# Patient Record
Sex: Male | Born: 1935
Health system: Southern US, Community
[De-identification: ages and names within clinical notes are randomized; demographics above are authoritative.]

## PROBLEM LIST (undated history)

## (undated) DIAGNOSIS — I2510 Atherosclerotic heart disease of native coronary artery without angina pectoris: Secondary | ICD-10-CM

## (undated) DIAGNOSIS — E785 Hyperlipidemia, unspecified: Secondary | ICD-10-CM

## (undated) DIAGNOSIS — N529 Male erectile dysfunction, unspecified: Secondary | ICD-10-CM

## (undated) DIAGNOSIS — I10 Essential (primary) hypertension: Secondary | ICD-10-CM

## (undated) HISTORY — DX: Hyperlipidemia, unspecified: E78.5

## (undated) HISTORY — DX: Essential (primary) hypertension: I10

## (undated) HISTORY — DX: Atherosclerotic heart disease of native coronary artery without angina pectoris: I25.10

## (undated) HISTORY — DX: Male erectile dysfunction, unspecified: N52.9

---

## 2005-02-01 ENCOUNTER — Encounter: Payer: Self-pay | Admitting: Family Medicine

## 2005-11-01 ENCOUNTER — Encounter: Payer: Self-pay | Admitting: Family Medicine

## 2009-10-03 ENCOUNTER — Encounter: Payer: Self-pay | Admitting: Family Medicine

## 2009-10-04 ENCOUNTER — Encounter: Payer: Self-pay | Admitting: Family Medicine

## 2009-10-06 ENCOUNTER — Ambulatory Visit: Payer: Self-pay | Admitting: Family Medicine

## 2009-10-06 ENCOUNTER — Encounter: Admission: RE | Admit: 2009-10-06 | Discharge: 2009-10-06 | Payer: Self-pay | Admitting: Family Medicine

## 2009-10-06 DIAGNOSIS — M109 Gout, unspecified: Secondary | ICD-10-CM | POA: Insufficient documentation

## 2009-10-06 DIAGNOSIS — N183 Chronic kidney disease, stage 3 unspecified: Secondary | ICD-10-CM | POA: Insufficient documentation

## 2009-10-06 DIAGNOSIS — E785 Hyperlipidemia, unspecified: Secondary | ICD-10-CM | POA: Insufficient documentation

## 2009-10-06 DIAGNOSIS — E1129 Type 2 diabetes mellitus with other diabetic kidney complication: Secondary | ICD-10-CM | POA: Insufficient documentation

## 2009-10-06 DIAGNOSIS — Z87898 Personal history of other specified conditions: Secondary | ICD-10-CM | POA: Insufficient documentation

## 2009-10-06 LAB — CONVERTED CEMR LAB: Hgb A1c MFr Bld: 5.7 %

## 2009-10-07 ENCOUNTER — Encounter: Payer: Self-pay | Admitting: Family Medicine

## 2009-10-07 LAB — CONVERTED CEMR LAB
Albumin: 4.4 g/dL (ref 3.5–5.2)
Alkaline Phosphatase: 42 units/L (ref 39–117)
BUN: 19 mg/dL (ref 6–23)
CO2: 26 meq/L (ref 19–32)
Eosinophils Absolute: 0.1 10*3/uL (ref 0.0–0.7)
Eosinophils Relative: 2 % (ref 0–5)
Glucose, Bld: 103 mg/dL — ABNORMAL HIGH (ref 70–99)
HCT: 40.9 % (ref 39.0–52.0)
Lymphocytes Relative: 26 % (ref 12–46)
Lymphs Abs: 1.3 10*3/uL (ref 0.7–4.0)
MCV: 89.7 fL (ref 78.0–100.0)
Monocytes Relative: 10 % (ref 3–12)
Potassium: 4.6 meq/L (ref 3.5–5.3)
RBC: 4.56 M/uL (ref 4.22–5.81)
Sodium: 140 meq/L (ref 135–145)
Total Protein: 6.9 g/dL (ref 6.0–8.3)
WBC: 5.1 10*3/uL (ref 4.0–10.5)

## 2009-10-11 ENCOUNTER — Telehealth (INDEPENDENT_AMBULATORY_CARE_PROVIDER_SITE_OTHER): Payer: Self-pay | Admitting: *Deleted

## 2010-01-05 ENCOUNTER — Ambulatory Visit: Payer: Self-pay | Admitting: Family Medicine

## 2010-01-05 LAB — CONVERTED CEMR LAB
Albumin/Creatinine Ratio, Urine, POC: <30
Bilirubin Urine: NEGATIVE
Creatinine,U: 300 mg/dL
Glucose, Urine, Semiquant: NEGATIVE
Ketones, urine, test strip: NEGATIVE
Microalbumin U total vol: 10 mg/L
Specific Gravity, Urine: 1.025
Urobilinogen, UA: 0.2

## 2010-01-09 ENCOUNTER — Encounter: Payer: Self-pay | Admitting: Family Medicine

## 2010-01-10 LAB — CONVERTED CEMR LAB
AST: 9 units/L (ref 0–37)
Albumin: 4.4 g/dL (ref 3.5–5.2)
Alkaline Phosphatase: 54 units/L (ref 39–117)
BUN: 19 mg/dL (ref 6–23)
Creatinine, Ser: 1.51 mg/dL — ABNORMAL HIGH (ref 0.40–1.50)
Glucose, Bld: 161 mg/dL — ABNORMAL HIGH (ref 70–99)
HDL: 37 mg/dL — ABNORMAL LOW (ref >39)
LDL Cholesterol: 90 mg/dL (ref 0–99)
Potassium: 4.7 meq/L (ref 3.5–5.3)
Total Bilirubin: 1.2 mg/dL (ref 0.3–1.2)
Total CHOL/HDL Ratio: 4.2
Triglycerides: 136 mg/dL (ref <150)
Uric Acid, Serum: 4.8 mg/dL (ref 4.0–7.8)
VLDL: 27 mg/dL (ref 0–40)

## 2010-02-17 ENCOUNTER — Telehealth: Payer: Self-pay | Admitting: Family Medicine

## 2010-02-21 ENCOUNTER — Telehealth: Payer: Self-pay | Admitting: Family Medicine

## 2010-02-21 NOTE — Letter (Signed)
Summary: [facility]   Imported By: Lanelle Bal 11/09/2009 14:25:08  _____________________________________________________________________  External Attachment:    Type:   Image     Comment:   External Document

## 2010-02-21 NOTE — Letter (Signed)
Summary: Surgical Clearance/Drs Graylin Shiver & Ragno  Surgical Clearance/Drs Graylin Shiver & Ragno   Imported By: Lanelle Bal 10/18/2009 11:54:18  _____________________________________________________________________  External Attachment:    Type:   Image     Comment:   External Document

## 2010-02-21 NOTE — Letter (Signed)
Summary: [facility]  [facility]   Imported By: Lanelle Bal 10/19/2009 13:48:11  _____________________________________________________________________  External Attachment:    Type:   Image     Comment:   External Document

## 2010-02-21 NOTE — Letter (Signed)
Summary: [facility]   Imported By: Lanelle Bal 11/09/2009 14:24:24  _____________________________________________________________________  External Attachment:    Type:   Image     Comment:   External Document

## 2010-02-21 NOTE — Progress Notes (Signed)
Summary: RC to Dr. Coralie Keens nurse  Phone Note From Other Clinic   Caller: [facility] w/ Dr. Cleotis Nipper [phone] Summary of Call: Please call Beth back re: Pt's surgical clearance and [facility]. Initial call taken by: Payton Spark CMA,  October 11, 2009 12:21 PM  Follow-up for Phone Call        fax received from office. Follow-up by: Kathlene November,  October 11, 2009 2:05 PM

## 2010-02-21 NOTE — Assessment & Plan Note (Signed)
Summary: NOV: Pre-op for dental work   Vital Signs:  Patient profile:   76 year old male Height:      74 inches Weight:      271 pounds Pulse rate:   79 / minute BP sitting:   116 / 64  (right arm) Cuff size:   large  Vitals Entered By: Avon Gully CMA, Duncan Dull) (October 06, 2009 2:15 PM) CC: NP--medical clearance for dental surgery   CC:  NP--medical clearance for dental surgery.  History of Present Illness: Pre-op clearance for dental surgery. Also to estab care today.  He is diabetic, has gout, high cholesterol and TG adn BPH. He is estab with a nephrologist at [facility] but would like to change to someone in [facility].   Neg screen for depression on his intake sheet.  He performs all of his own ADLs.  His long term girlfriend is here with him today.    Habits & Providers  Alcohol-Tobacco-Diet     Alcohol drinks/day: 0     Tobacco Status: quit     Year Quit: 1986  Exercise-Depression-Behavior     Does Patient Exercise: no     STD Risk: never     Drug Use: no  Current Medications (verified): 1)  Lovaza 1 Gm Caps (Omega-3-Acid Ethyl Esters) .... Take One Tablet By Mouth Twice Daily 2)  Onglyza 5 Mg Tabs (Saxagliptin Hcl) .... Take One Tablet By Mouth Once A Day 3)  Diovan 320 Mg Tabs (Valsartan) .... Take One Tablet By Mouth Once   A Day 4)  Allopurinol 300 Mg Tabs (Allopurinol) .... Take One Tablet By Mouth Once A Day 5)  Trilipix 135 Mg Cpdr (Choline Fenofibrate) .... Take One Tablet By Mouth Once A Day 6)  Actos 30 Mg Tabs (Pioglitazone Hcl) .... Take One Tablet By Mouth Once A Day 7)  Flomax 0.4 Mg Caps (Tamsulosin Hcl) .... One Tablet By Mouth Once A Day 8)  Simvastatin 20 Mg Tabs (Simvastatin) .... One Tablet By Mouth Once A Day 9)  Lantus 100 Unit/ml Soln (Insulin Glargine) .Marland Kitchen.. 10  Units Once A Day  Allergies (verified): No Known Drug Allergies  Comments:  Nurse/Medical Assistant: The patient's medications and allergies were reviewed with the  patient and were updated in the Medication and Allergy Lists. Avon Gully CMA, Duncan Dull) (October 06, 2009 2:23 PM)  Past History:  Past Surgical History: None  Family History: Mother wtih DM  Social History: HS degree. Married Former Smoker, quit 1986 Alcohol use-no Drug use-no Regular exercise-no 2 caffeinated drinks per day. Smoking Status:  quit Does Patient Exercise:  no STD Risk:  never Drug Use:  no  Review of Systems       No fever/sweats/weakness, unexplained weight loss/gain.  No vison changes.  No difficulty hearing/ringing in ears, hay fever/allergies.  No chest pain/discomfort, palpitations.  No Br lump/nipple discharge.  No cough/wheeze.  No blood in BM, nausea/vomiting/diarrhea.  No nighttime urination, leaking urine, unusual vaginal bleeding, discharge (penis or vagina).  No muscle/joint pain. No rash, change in mole.  No HA, memory loss.  No anxiety, sleep d/o, depression.  No easy bruising/bleeding, unexplained lump   Physical Exam  General:  Well-developed,well-nourished,in no acute distress; alert,appropriate and cooperative throughout examination Head:  Normocephalic and atraumatic without obvious abnormalities. No apparent alopecia or balding. Eyes:  No corneal or conjunctival inflammation noted. EOMI. Perrla. Left lid drooping over the pupil Ears:  External ear exam shows no significant lesions or deformities.  Otoscopic  examination reveals clear canals, tympanic membranes are intact bilaterally without bulging, retraction, inflammation or discharge. Hearing is grossly normal bilaterally. Nose:  External nasal examination shows no deformity or inflammation.  Mouth:  Oral mucosa and oropharynx without lesions or exudates.  Teeth in good repair. Neck:  No deformities, masses, or tenderness noted. Chest Wall:  No deformities, masses, tenderness or gynecomastia noted. Lungs:  Normal respiratory effort, chest expands symmetrically. Lungs are clear to  auscultation, no crackles or wheezes. Heart:  Normal rate and regular rhythm. S1 and S2 normal without gallop, murmur, click, rub or other extra sounds. Abdomen:  Bowel sounds positive,abdomen soft and non-tender without masses, organomegaly or hernias noted. Msk:  No deformity or scoliosis noted of thoracic or lumbar spine.   Pulses:  R and L carotid,radial,dorsalis pedis and posterior tibial pulses are full and equal bilaterally Extremities:  No clubbing, cyanosis, edema, or deformity noted with normal full range of motion of all joints.   Neurologic:  No cranial nerve deficits noted. Station and gait are normal. DTRs are symmetrical throughout. Sensory, motor and coordinative functions appear intact. Skin:  Diffuse seborrheaic keratoses on his body. Seborrehic dermatitis on his scalp.  Cervical Nodes:  No lymphadenopathy noted Psych:  Cognition and judgment appear intact. Alert and cooperative with normal attention span and concentration. No apparent delusions, illusions, hallucinations   Impression & Recommendations:  Problem # 1:  HEALTH MAINTENANCE EXAM (ICD-V70.0) Exam is normal today.  EKG shows rate 68 bpm, NSR, no acute chagnes. IF labs and CXR are normal then he is OK for general anesthesia for his surgery.  Orders: T-Chest x-ray, 2 views (71020) EKG w/ Interpretation (93000)  Problem # 2:  DIABETES MELLITUS (ICD-250.00)  Dsicussed the risk of actos. i prefer he not be on this medication.  Off metformin due to kidney funciton.  Stop the Actos and increase the lantus to 20 units daily. Can increase from 10 to 20 units slowly in increments of two.  His updated medication list for this problem includes:    Onglyza 5 Mg Tabs (Saxagliptin hcl) .Marland Kitchen... Take one tablet by mouth once a day    Diovan 320 Mg Tabs (Valsartan) .Marland Kitchen... Take one tablet by mouth once   a day    Lantus 100 Unit/ml Soln (Insulin glargine) .Marland KitchenMarland KitchenMarland KitchenMarland Kitchen 10  units once a day    Lantus 100 Unit/ml Soln (Insulin glargine)  .Marland Kitchen... 20  units once a day  Orders: T-CBC w/Diff ([id number]) T-Comprehensive Metabolic Panel [id number]) Hgb A1C (44010UV)  Reviewed HgBA1c results: 5.7 (10/06/2009)  Complete Medication List: 1)  Lovaza 1 Gm Caps (Omega-3-acid ethyl esters) .... Take one tablet by mouth twice daily 2)  Onglyza 5 Mg Tabs (Saxagliptin hcl) .... Take one tablet by mouth once a day 3)  Diovan 320 Mg Tabs (Valsartan) .... Take one tablet by mouth once   a day 4)  Allopurinol 300 Mg Tabs (Allopurinol) .... Take one tablet by mouth once a day 5)  Trilipix 135 Mg Cpdr (Choline fenofibrate) .... Take one tablet by mouth once a day 6)  Flomax 0.4 Mg Caps (Tamsulosin hcl) .... One tablet by mouth once a day 7)  Simvastatin 20 Mg Tabs (Simvastatin) .... One tablet by mouth once a day 8)  Lantus 100 Unit/ml Soln (Insulin glargine) .Marland Kitchen.. 10  units once a day 9)  Lantus 100 Unit/ml Soln (Insulin glargine) .... 20  units once a day  Patient Instructions: 1)  We will call you with your lab results.  2)  Once complete we will fax over note and EKG, and chest xray and labs to your dental surgeon.  3)  Stop you actos adn increase your lantus to 20 units.    Immunization History:  Influenza Immunization History:    Influenza:  historical (10/05/2009)   Flex Sig Next Due:  Not Indicated Colonoscopy Result Date:  01/23/2004 Colonoscopy Result:  normal Hemoccult Next Due:  Not Indicated   Laboratory Results   Blood Tests   Date/Time Received: 10/06/09 Date/Time Reported: 10/06/09  HGBA1C: 5.7%   (Normal Range: Non-Diabetic - 3-6%   Control Diabetic - 6-8%)

## 2010-02-23 NOTE — Assessment & Plan Note (Signed)
Summary: 3 MONTH FU M   Vital Signs:  Patient profile:   76 year old male Height:      74 inches Weight:      265 pounds BMI:     34.15 O2 Sat:      95 % on Room air Pulse rate:   83 / minute BP sitting:   131 / 83  (left arm) Cuff size:   large  Vitals Entered By: Payton Spark CMA (January 05, 2010 3:08 PM)  O2 Flow:  Room air CC: F/U DM   CC:  F/U DM.  History of Present Illness: Here for f/u DM. Doing well overall. Recently had his teeth removed and has not gotten his partial so his diet has changed dramatically. they were unable to get the onglyza.   Did have an episdoe of low left bck pain for a few days. Felt it was his kidney so drank cranberry juice and his pain resolved.    Diabetes Management History:      The patient is a 76 years old male who comes in for evaluation of DM Type 2.  He states understanding of dietary principles and is following his diet appropriately.  No sensory loss is reported.  Self foot exams are not being performed.  He is checking home blood sugars.  He says that he is not exercising regularly.        Hypoglycemic symptoms are not occurring.  Other comments include: Up to 20 units on Lantus. Uses the insulin at night.  .        Since his last visit, no infections have occurred.  Treatment plan changes were initiated by patient.        His home blood sugars include fasting blood sugars: highest: 137; 9:00 PM blood sugars: highest: 277.    Allergies: No Known Drug Allergies  Physical Exam  General:  Well-developed,well-nourished,in no acute distress; alert,appropriate and cooperative throughout examination Head:  Normocephalic and atraumatic without obvious abnormalities. No apparent alopecia or balding. Eyes:  No corneal or conjunctival inflammation noted. EOMI. Perrla. Ears:  External ear exam shows no significant lesions or deformities.  Otoscopic examination reveals clear canals, tympanic membranes are intact bilaterally without bulging,  retraction, inflammation or discharge. Hearing is grossly normal bilaterally. Lungs:  Normal respiratory effort, chest expands symmetrically. Lungs are clear to auscultation, no crackles or wheezes. Heart:  Normal rate and regular rhythm. S1 and S2 normal without gallop, murmur, click, rub or other extra sounds. Skin:  no rashes.   Psych:  Cognition and judgment appear intact. Alert and cooperative with normal attention span and concentration. No apparent delusions, illusions, hallucinations   Impression & Recommendations:  Problem # 1:  DIABETES MELLITUS (ICD-250.00) REminded him to get his eye exam. Not sure why he couldn't get the onglyza. We haven't gotten anhy prior auths.  F/U in 3 months.  Call if sugars drop especially as he tries to loose weight.  A1C looks great today.  I would really love to try the onzlyza and see how he does on that without the insulin.  His updated medication list for this problem includes:    Onglyza 5 Mg Tabs (Saxagliptin hcl) .Marland Kitchen... Take one tablet by mouth once a day    Diovan 320 Mg Tabs (Valsartan) .Marland Kitchen... Take one tablet by mouth once   a day    Lantus Solostar 100 Unit/ml Soln (Insulin glargine) .Marland Kitchen... 20 units [place] daily.  Orders: Fingerstick (36416) Hgb A1C (81191YN) Urine Microalbumin (  84696) Creatinine  (29528)  Problem # 2:  HYPERLIPIDEMIA (ICD-272.4) Per his insurance he has to trey something else before they will pay for the lovaza since I don't have TG > 500 documented. IN his old records his TG were high but he was also on medication at the time. He is due to recheck this.  The following medications were removed from the medication list:    Trilipix 135 Mg Cpdr (Choline fenofibrate) .Marland Kitchen... Take one tablet by mouth once a day His updated medication list for this problem includes:    Niaspan 500 Mg Cr-tabs (Niacin (antihyperlipidemic)) .Marland Kitchen... Take 1 tablet by mouth once a day at bedtime. if no hx of gi bleed can take a baby asa at dinner time and  this will help with flushing.    Simvastatin 20 Mg Tabs (Simvastatin) ..... One tablet by mouth once a day    Trilipix 135 Mg Cpdr (Choline fenofibrate) .Marland Kitchen... Take 1 tablet by mouth once a day  Problem # 3:  GOUT, UNSPECIFIED (ICD-274.9) Due to check uric acid level to make sure at goal. His updated medication list for this problem includes:    Allopurinol 300 Mg Tabs (Allopurinol) .Marland Kitchen... Take one tablet by mouth once a day  Orders: T-Uric Acid (Blood) ([id number])  Problem # 4:  CHRONIC KIDNEY DISEASE STAGE III (MODERATE) (ICD-585.3) Assessment: Deteriorated Pt would like to trasfer his nephrology care to [facility] if possible.  Orders: Nephrology Referral (Nephro) UA Dipstick w/o Micro (automated)  (81003) Creatinine  (72536)  Complete Medication List: 1)  Niaspan 500 Mg Cr-tabs (Niacin (antihyperlipidemic)) .... Take 1 tablet by mouth once a day at bedtime. if no hx of gi bleed can take a baby asa at dinner time and this will help with flushing. 2)  Onglyza 5 Mg Tabs (Saxagliptin hcl) .... Take one tablet by mouth once a day 3)  Diovan 320 Mg Tabs (Valsartan) .... Take one tablet by mouth once   a day 4)  Allopurinol 300 Mg Tabs (Allopurinol) .... Take one tablet by mouth once a day 5)  Flomax 0.4 Mg Caps (Tamsulosin hcl) .... One tablet by mouth once a day 6)  Simvastatin 20 Mg Tabs (Simvastatin) .... One tablet by mouth once a day 7)  Lantus Solostar 100 Unit/ml Soln (Insulin glargine) .... 20 units [place] daily. 8)  Needle For Lantus Solostar.  .... Dx 250.00 injects 1-2 x a daily. 9)  Trilipix 135 Mg Cpdr (Choline fenofibrate) .... Take 1 tablet by mouth once a day  Other Orders: T-Comprehensive Metabolic Panel [id number]) T-Lipid Profile (95638-75643)  Patient Instructions: 1)  Remember to get your diabetic eye exam.  2)  Due for fasting labwork.   3)  Continue current  regimen and follow up in the 3 months.  Prescriptions: TRILIPIX 135 MG CPDR (CHOLINE FENOFIBRATE)  Take 1 tablet by mouth once a day Brand medically necessary #30 x 2   Entered and Authorized by:   Nani Gasser MD   Signed by:   Nani Gasser MD on 01/05/2010   Method used:   Electronically to        [facility]* (retail)       [address]       [place], [place]  [address]       Ph: [phone]       Fax: [phone]   RxID:   [id number]    Orders Added: 1)  Fingerstick [36416] 2)  Hgb A1C [83036QW] 3)  T-Comprehensive Metabolic Panel [[id number]]  4)  T-Lipid Profile [80061-22930] 5)  T-Uric Acid (Blood) [84550-23180] 6)  Est. Patient Level IV [71062] 7)  Nephrology Referral [Nephro] 8)  UA Dipstick w/o Micro (automated)  [81003] 9)  Urine Microalbumin [82044] 10)  Creatinine  [82570]     Laboratory Results   Urine Tests    Routine Urinalysis   Color: yellow Appearance: Clear Glucose: negative   (Normal Range: Negative) Bilirubin: negative   (Normal Range: Negative) Ketone: negative   (Normal Range: Negative) Spec. Gravity: 1.025   (Normal Range: 1.003-1.035) Blood: negative   (Normal Range: Negative) pH: 5.5   (Normal Range: 5.0-8.0) Protein: negative   (Normal Range: Negative) Urobilinogen: 0.2   (Normal Range: 0-1) Nitrite: negative   (Normal Range: Negative) Leukocyte Esterace: negative   (Normal Range: Negative)  Microalbumin (urine): 10 mg/L Creatinine: 300mg /dL  A:C Ratio <69

## 2010-03-01 NOTE — Progress Notes (Signed)
Summary: [facility] Refills  Phone Note Call from Patient Call back at Home Phone [phone]   Caller: wife-Mrs. Fulton Mole Call For: Nani Gasser MD Reason for Call: Talk to Nurse Summary of Call: [facility] on [facility]s.  Advised pt meds were refilled but sent to wrong pharmacy.  Advised I will correct.  Rx's cancelled at local pharmacy. Initial call taken by: Francee Piccolo CMA Duncan Dull),  February 21, 2010 10:31 AM    Prescriptions: SIMVASTATIN 20 MG TABS (SIMVASTATIN) one tablet by mouth once a day  #90 x 3   Entered by:   Francee Piccolo CMA (AAMA)   Authorized by:   Nani Gasser MD   Signed by:   Francee Piccolo CMA (AAMA) on 02/21/2010   Method used:   Faxed to ...       [facility] (mail[facility]order)             , [place]         Ph:        Fax: [phone]   RxID:   [id number] FLOMAX 0.4 MG CAPS (TAMSULOSIN HCL) one tablet by mouth once a day  #90 x 3   Entered by:   Francee Piccolo CMA (AAMA)   Authorized by:   Nani Gasser MD   Signed by:   Francee Piccolo CMA (AAMA) on 02/21/2010   Method used:   Faxed to ...       [facility] (mail-order)             , [place]         Ph:        Fax: [phone]   RxID:   [id number] ALLOPURINOL 300 MG TABS (ALLOPURINOL) take one tablet by mouth once a day  #90 x 3   Entered by:   Francee Piccolo CMA (AAMA)   Authorized by:   Nani Gasser MD   Signed by:   Francee Piccolo CMA (AAMA) on 02/21/2010   Method used:   Faxed to ...       [facility] (mail[facility]             , [place]         Ph:        Fax: [phone]   RxID:   [id number] NIASPAN 500 MG CR-TABS (NIACIN (ANTIHYPERLIPIDEMIC)) Take 1 tablet by mouth once a day at bedtime. If no hx of GI bleed can take a baby ASA at dinner time and this will help with flushing.  #90 x 3   Entered by:   Francee Piccolo CMA (AAMA)   Authorized by:   Nani Gasser MD   Signed  by:   Francee Piccolo CMA (AAMA) on 02/21/2010   Method used:   Faxed to ...       [facility] (mail-order)             , [place]         Ph:        Fax: [phone]   RxID:   [id number]

## 2010-03-01 NOTE — Progress Notes (Signed)
Summary: medication refills  Phone Note Refill Request Call back at [facility] Phone [phone] Message from:  wife-Mrs. Nishant Stieber  Refills Requested: Medication #1:  NIASPAN 500 MG CR-TABS Take 1 tablet by mouth once a day at bedtime. If no hx of GI bleed can take a baby ASA at dinner time and this will help with flushing.   Dosage confirmed as above?Dosage Confirmed   Last Refilled: 10/31/2009  Medication #2:  ALLOPURINOL 300 MG TABS take one tablet by mouth once a day   Dosage confirmed as above?Dosage Confirmed   Notes: never filled in EMR  Medication #3:  FLOMAX 0.4 MG CAPS one tablet by mouth once a day   Dosage confirmed as above?Dosage Confirmed   Notes: never filled in EMR  Medication #4:  SIMVASTATIN 20 MG TABS one tablet by mouth once a day   Dosage confirmed as above?Dosage Confirmed   Notes: never filled in EMR last office visit 12/15/1.  RX's need to be faxed to [facility]   Method Requested: Fax to Mail Away Pharmacy Next Appointment Scheduled: 04/06/10 Initial call taken by: Francee Piccolo CMA Duncan Dull),  February 17, 2010 1:42 PM    Prescriptions: SIMVASTATIN 20 MG TABS (SIMVASTATIN) one tablet by mouth once a day  #90 x 3   Entered and Authorized by:   Nani Gasser MD   Signed by:   Nani Gasser MD on 02/20/2010   Method used:   Printed then faxed to ...       [facility]* (retail)       [address]       [place], [place]  [address]       Ph: [phone]       Fax: [phone]   RxID:   [id number] FLOMAX 0.4 MG CAPS (TAMSULOSIN HCL) one tablet by mouth once a day  #90 x 3   Entered and Authorized by:   Nani Gasser MD   Signed by:   Nani Gasser MD on 02/20/2010   Method used:   Printed then faxed to ...       [facility]* (retail)       [address]       [place], [place]  [address]       Ph: [phone]       Fax: [phone]   RxID:   [id number] ALLOPURINOL 300 MG TABS (ALLOPURINOL) take one tablet by  mouth once a day  #90 x 3   Entered and Authorized by:   Nani Gasser MD   Signed by:   Nani Gasser MD on 02/20/2010   Method used:   Printed then faxed to ...       [facility]* (retail)       [address]       [place], [place]  [address]       Ph: [phone]       Fax: [phone]   RxID:   [id number] NIASPAN 500 MG CR-TABS (NIACIN (ANTIHYPERLIPIDEMIC)) Take 1 tablet by mouth once a day at bedtime. If no hx of GI bleed can take a baby ASA at dinner time and this will help with flushing.  #90 x 3   Entered and Authorized by:   Nani Gasser MD   Signed by:   Nani Gasser MD on 02/20/2010   Method used:   Printed then faxed to ...       [facility]* (retail)       [address]  [place], [place]  [address]       Ph: [phone]       Fax: [phone]   RxID:   [id number]

## 2010-04-06 ENCOUNTER — Encounter: Payer: Self-pay | Admitting: Family Medicine

## 2010-04-06 ENCOUNTER — Ambulatory Visit (INDEPENDENT_AMBULATORY_CARE_PROVIDER_SITE_OTHER): Payer: Medicare Other | Admitting: Family Medicine

## 2010-04-06 DIAGNOSIS — J069 Acute upper respiratory infection, unspecified: Secondary | ICD-10-CM

## 2010-04-06 DIAGNOSIS — E119 Type 2 diabetes mellitus without complications: Secondary | ICD-10-CM

## 2010-04-11 NOTE — Assessment & Plan Note (Signed)
Summary: 3 mo fu vew   Vital Signs:  Patient profile:   76 year old male Height:      74 inches Weight:      247 pounds Pulse rate:   112 / minute BP sitting:   120 / 79  (right arm) Cuff size:   large  Vitals Entered By: Avon Gully CMA, Duncan Dull) (April 06, 2010 2:17 PM) CC: f/u DM   CC:  f/u DM.  History of Present Illness: Cold sxs about 7 days cough and nasal congestion. Low grade fever.  No GI sxs. +ST.  No ear pain. No SOB. mild occ HA.  Took Advil cold and it didn't help. Feels a little better than a couple of days ago.   Diabetes Management History:      The patient is a 76 years old male who comes in for evaluation of DM Type 2.  He states understanding of dietary principles.  Sensory loss is noted.  Self foot exams are being performed.  He is checking home blood sugars.  He says that he is not exercising regularly.        Hypoglycemic symptoms are not occurring.  No hyperglycemic symptoms are reported.        There are no symptoms to suggest diabetic complications.  Since last visit, the following infection(s) have been reported: URI.  No changes have been made to his treatment plan since last visit.        His home blood sugars include fasting blood sugars: average: 120.; 9:00 PM blood sugars: average: 180.    Current Medications (verified): 1)  Niaspan 500 Mg Cr-Tabs (Niacin (Antihyperlipidemic)) .... Take 1 Tablet By Mouth Once A Day At Bedtime. If No Hx of Gi Bleed Can Take A Baby Asa At [employer] Time and This Will Help With Flushing. 2)  Onglyza 5 Mg Tabs (Saxagliptin Hcl) .... Take One Tablet By Mouth Once A Day 3)  Diovan 320 Mg Tabs (Valsartan) .... Take One Tablet By Mouth Once   A Day 4)  Allopurinol 300 Mg Tabs (Allopurinol) .... Take One Tablet By Mouth Once A Day 5)  Flomax 0.4 Mg Caps (Tamsulosin Hcl) .... One Tablet By Mouth Once A Day 6)  Simvastatin 20 Mg Tabs (Simvastatin) .... One Tablet By Mouth Once A Day 7)  Lantus Solostar 100 Unit/ml Soln (Insulin  Glargine) .... 20 Units [place] Daily. 8)  Needle For Lantus Solostar. .... Dx 250.00 Injects 1-2 X A Daily. 9)  Trilipix 135 Mg Cpdr (Choline Fenofibrate) .... Take 1 Tablet By Mouth Once A Day  Allergies (verified): No Known Drug Allergies  Comments:  Nurse/Medical Assistant: The patient's medications and allergies were reviewed with the patient and were updated in the Medication and Allergy Lists. Avon Gully CMA, Duncan Dull) (April 06, 2010 2:17 PM)  Physical Exam  General:  Well-developed,well-nourished,in no acute distress; alert,appropriate and cooperative throughout examination Head:  Normocephalic and atraumatic without obvious abnormalities. Eyes:  No corneal or conjunctival inflammation noted. EOMI. Perrla. Ears:  External ear exam shows no significant lesions or deformities.  Otoscopic examination reveals clear canals, tympanic membranes are intact bilaterally without bulging, retraction, inflammation or discharge. Hearing is grossly normal bilaterally. Nose:  External nasal examination shows no deformity or inflammation. Nasal mucosa are pink and moist without lesions or exudates. Mouth:  Oral mucosa and oropharynx without lesions or exudates.   Neck:  No deformities, masses, or tenderness noted. Lungs:  Normal respiratory effort, chest expands symmetrically. Lungs are clear to  auscultation, no crackles or wheezes. Heart:  Normal rate and regular rhythm. S1 and S2 normal without gallop, murmur, click, rub or other extra sounds. Skin:  no rashes.   Cervical Nodes:  No lymphadenopathy noted Psych:  Cognition and judgment appear intact. Alert and cooperative with normal attention span and concentration. No apparent delusions, illusions, hallucinations   Impression & Recommendations:  Problem # 1:  DIABETES MELLITUS (ICD-250.00)  he is here to followup diabetes his A1c is excellent today. he did remember having his Pneumovax 2 years ago cemented this into the system. he is taking  the Avalide and tolerating it well. has not had any side effects. He is on an ARB. I will follow him up in 4 months since he is very well controlled. he says he does not need any refills on his diabetic supplies. His updated medication list for this problem includes:    Onglyza 5 Mg Tabs (Saxagliptin hcl) .Marland Kitchen... Take one tablet by mouth once a day    Diovan 320 Mg Tabs (Valsartan) .Marland Kitchen... Take one tablet by mouth once   a day    Lantus Solostar 100 Unit/ml Soln (Insulin glargine) .Marland Kitchen... 20 units [place] daily.  Orders: Fingerstick (36416) Hgb A1C (16109UE)  Problem # 2:  URI (ICD-465.9)  Instructed on symptomatic treatment. Call if symptoms persist or worsen.  I did tell him if he is not feeling better on Monday  2 call the office and I will treat him for sinusitis. He  is feeling a little better today than he did approximately 2 days ago so I think he is recovering well  Complete Medication List: 1)  Niaspan 500 Mg Cr-tabs (Niacin (antihyperlipidemic)) .... Take 1 tablet by mouth once a day at bedtime. if no hx of gi bleed can take a baby asa at dinner time and this will help with flushing. 2)  Onglyza 5 Mg Tabs (Saxagliptin hcl) .... Take one tablet by mouth once a day 3)  Diovan 320 Mg Tabs (Valsartan) .... Take one tablet by mouth once   a day 4)  Allopurinol 300 Mg Tabs (Allopurinol) .... Take one tablet by mouth once a day 5)  Flomax 0.4 Mg Caps (Tamsulosin hcl) .... One tablet by mouth once a day 6)  Simvastatin 20 Mg Tabs (Simvastatin) .... One tablet by mouth once a day 7)  Lantus Solostar 100 Unit/ml Soln (Insulin glargine) .... 20 units [place] daily. 8)  Needle For Lantus Solostar.  .... Dx 250.00 injects 1-2 x a daily. 9)  Trilipix 135 Mg Cpdr (Choline fenofibrate) .... Take 1 tablet by mouth once a day  Other Orders: T-Comprehensive Metabolic Panel [phone]) T-Lipid Profile ([id number])  Patient Instructions: 1)  Please schedule a follow-up appointment in 4 months for your diabetes  .  2)  Go for labs when able.Must fast for 8 hours 3)  Call me if your cough and sinuses are not better by Monday.    Orders Added: 1)  Fingerstick [36416] 2)  Hgb A1C [83036QW] 3)  T-Comprehensive Metabolic Panel [[id number]] 4)  T-Lipid Profile [[id number]] 5)  Est. Patient Level IV [08657]   Immunization History:  Pneumovax Immunization History:    Pneumovax:  pneumovax (medicare) (01/23/2008)   Immunization History:  Pneumovax Immunization History:    Pneumovax:  Pneumovax (Medicare) (01/23/2008)  Laboratory Results   Blood Tests   Date/Time Received: 04/06/10 Date/Time Reported: 04/06/10  HGBA1C: 5.7%   (Normal Range: Non-Diabetic - 3-6%   Control Diabetic - 6-8%)  Immunization History:  Pneumovax Immunization History:    Pneumovax:  pneumovax (medicare) (01/23/2008)

## 2010-05-01 ENCOUNTER — Other Ambulatory Visit: Payer: Self-pay | Admitting: Family Medicine

## 2010-05-01 MED ORDER — VALSARTAN 320 MG PO TABS: 320.0000 mg | ORAL_TABLET | Freq: Every day | ORAL | Status: DC

## 2010-05-01 MED ORDER — INSULIN GLARGINE 100 UNIT/ML ~~LOC~~ SOLN: 20.0000 [IU] | Freq: Every day | SUBCUTANEOUS | Status: DC

## 2010-05-01 MED ORDER — SAXAGLIPTIN HCL 5 MG PO TABS: 5.0000 mg | ORAL_TABLET | Freq: Every day | ORAL | Status: DC

## 2010-05-04 ENCOUNTER — Other Ambulatory Visit: Payer: Self-pay | Admitting: Family Medicine

## 2010-08-04 ENCOUNTER — Other Ambulatory Visit: Payer: Self-pay | Admitting: Family Medicine

## 2010-08-04 ENCOUNTER — Encounter: Payer: Self-pay | Admitting: Family Medicine

## 2010-08-04 LAB — COMPREHENSIVE METABOLIC PANEL
Alkaline Phosphatase: 71 U/L (ref 39–117)
BUN: 15 mg/dL (ref 6–23)
CO2: 28 mEq/L (ref 19–32)
Glucose, Bld: 132 mg/dL — ABNORMAL HIGH (ref 70–99)
Sodium: 140 mEq/L (ref 135–145)
Total Bilirubin: 0.6 mg/dL (ref 0.3–1.2)
Total Protein: 7.1 g/dL (ref 6.0–8.3)

## 2010-08-04 LAB — LIPID PANEL
Cholesterol: 142 mg/dL (ref 0–200)
HDL: 38 mg/dL — ABNORMAL LOW (ref >39)
Triglycerides: 130 mg/dL (ref <150)
VLDL: 26 mg/dL (ref 0–40)

## 2010-08-05 ENCOUNTER — Telehealth: Payer: Self-pay | Admitting: Family Medicine

## 2010-08-05 NOTE — Telephone Encounter (Signed)
Call pt: Labs look OK.  Kidney function is stable.

## 2010-08-07 NOTE — Telephone Encounter (Signed)
Advised pt of results. States has appt tomr.

## 2010-08-08 ENCOUNTER — Ambulatory Visit (INDEPENDENT_AMBULATORY_CARE_PROVIDER_SITE_OTHER): Payer: Medicare Other | Admitting: Family Medicine

## 2010-08-08 ENCOUNTER — Telehealth: Payer: Self-pay | Admitting: Family Medicine

## 2010-08-08 ENCOUNTER — Encounter: Payer: Self-pay | Admitting: Family Medicine

## 2010-08-08 VITALS — BP 132/86 | HR 98 | Ht 76.0 in | Wt 243.0 lb

## 2010-08-08 DIAGNOSIS — E119 Type 2 diabetes mellitus without complications: Secondary | ICD-10-CM

## 2010-08-08 MED ORDER — AMBULATORY NON FORMULARY MEDICATION: Status: DC

## 2010-08-08 NOTE — Assessment & Plan Note (Signed)
His A1c looks great today. He denies having any lows so we will continue his current regimen. He continues to do well we can probably decrease or even hold his Lantus. His last eye exam was over 6 years ago and I did recommend that he try to get one this year. I recommended several places her in the local area. Followup in 3-4 months. He has some decreased sensitivity on the small monofilament exam he also has thick calluses.

## 2010-08-08 NOTE — Telephone Encounter (Signed)
Copies to him that he can schedule for biopsy on that lesion on his right jaw any time. Typically Medicare doesn't like to pay for procedures and office visits such as for his diabetes on the same day, and that is why I recommend he come in for a separate appointment for that.

## 2010-08-08 NOTE — Progress Notes (Signed)
  Subjective:    Patient ID: Joshua Scott, male    DOB: [birth year], 76 y.o.   MRN: [id number]  Diabetes He presents for his follow-up diabetic visit. He has type 2 diabetes mellitus. His disease course has been stable. There are no hypoglycemic associated symptoms. Pertinent negatives for diabetes include no chest pain, no foot ulcerations, no polydipsia and no polyuria. There are no hypoglycemic complications. Symptoms are stable. Current diabetic treatment includes oral agent (dual therapy). He is compliant with treatment all of the time. He rarely participates in exercise. His home blood glucose trend is increasing steadily. An ACE inhibitor/angiotensin II receptor blocker is being taken. Eye exam is not current.      Review of Systems  Cardiovascular: Negative for chest pain.  Genitourinary: Negative for polyuria.  Hematological: Negative for polydipsia.       Objective:   Physical Exam  Constitutional: He is oriented to person, place, and time. He appears well-developed and well-nourished.  HENT:  Head: Normocephalic and atraumatic.  Cardiovascular: Normal rate, regular rhythm and normal heart sounds.        No carotid bruits  Pulmonary/Chest: Effort normal and breath sounds normal.  Musculoskeletal: He exhibits no edema.  Neurological: He is alert and oriented to person, place, and time.  Skin: Skin is warm and dry.       He does have what appears to be a seborrheic keratosis on his right lower jaw. It is quite raised. It is darkly pigmented. He says it is only been present for a couple months.  Psychiatric: He has a normal mood and affect. His behavior is normal.          Assessment & Plan:  I did give him a prescription today to get his shingles vaccine at the pharmacy.

## 2010-08-08 NOTE — Patient Instructions (Signed)
Remember to get your eye exam. Follow up in 4 months.  Continue current dose of Lantus. Start exercising more.

## 2010-08-09 ENCOUNTER — Telehealth: Payer: Self-pay | Admitting: Family Medicine

## 2010-08-09 NOTE — Telephone Encounter (Signed)
Left message on the pt cell number that diff visits is needed because insurance will not pay for diabetes checks, etc on the same day as another appt. LMOM for the pt to return the call. Jarvis Newcomer, LPN [facility]

## 2010-08-12 NOTE — Telephone Encounter (Signed)
Closed

## 2010-08-17 ENCOUNTER — Encounter: Payer: Self-pay | Admitting: Family Medicine

## 2010-08-17 ENCOUNTER — Other Ambulatory Visit: Payer: Self-pay | Admitting: Family Medicine

## 2010-08-17 ENCOUNTER — Ambulatory Visit (INDEPENDENT_AMBULATORY_CARE_PROVIDER_SITE_OTHER): Payer: Medicare Other | Admitting: Family Medicine

## 2010-08-17 DIAGNOSIS — L570 Actinic keratosis: Secondary | ICD-10-CM

## 2010-08-17 DIAGNOSIS — D229 Melanocytic nevi, unspecified: Secondary | ICD-10-CM

## 2010-08-17 DIAGNOSIS — L821 Other seborrheic keratosis: Secondary | ICD-10-CM

## 2010-08-17 NOTE — Progress Notes (Signed)
  Shave Biopsy Procedure Note  Pre-operative Diagnosis: Dysplastic nevi  Post-operative Diagnosis: Unknow, will await bx. Results.   Locations:right upper jaw line near the ear.   Indications: atypical nevus with rapid change in size  Anesthesia: Lidocaine 1% without epinephrine without added sodium bicarbonate  Procedure Details  History of allergy to iodine: no  Patient informed of the risks (including bleeding and infection) and benefits of the  procedure and Verbal informed consent obtained.  The lesion and surrounding area were given a sterile prep using betadyne and draped in the usual sterile fashion. A scalpel was used to shave an area of skin approximately 0.7cm by 0.8cm.  Hemostasis achieved with alumuninum chloride. Antibiotic ointment and a sterile dressing applied.  The specimen was sent for pathologic examination. The patient tolerated the procedure well.  EBL: 0.5 ml  Findings:   Condition: Stable  Complications: none.  Plan: 1. Instructed to keep the wound dry and covered for 24-48h and clean thereafter. 2. Warning signs of infection were reviewed.   3. Recommended that the patient use OTC acetaminophen as needed for pain.  4. Return prn and in 5. F/U wound care reviewed.    Subjective:     Joshua Scott is a 76 y.o. male who presents for follow up of actinic keratosis. . Previous treatment for prior lesions has been none. Past history of skin cancer: unknown. Other skin problems: no.    Review of Systems     Objective:    Physical Exam  Skin: Raised erythematous scaly circumscribed area with gray/white keratotic scale present on the face    Assessment:    Actinic Keratosis of Forehead bilat, upper cheeks and a  and under right eye    Plan:    1.  Cryosurgery explained to the patient and then performed with Liquid Nitrogen via CRY-AC Spray [id number]} lesions.  Post op course explained. 2.  3. Continue sun protective measures and  avoidance. 4. Observe closely for skin damage/changes and contact us if worrisome changes occur. 5. Verbal patient instruction given. 6. Follow up as needed for acute illness.

## 2010-08-18 ENCOUNTER — Telehealth: Payer: Self-pay | Admitting: Family Medicine

## 2010-08-18 NOTE — Telephone Encounter (Signed)
Pt.notified

## 2010-08-18 NOTE — Telephone Encounter (Signed)
Call pt: Bx shows an irritated seborrheic keratosis. Benign lesion.

## 2010-12-11 ENCOUNTER — Ambulatory Visit (INDEPENDENT_AMBULATORY_CARE_PROVIDER_SITE_OTHER): Payer: Medicare Other | Admitting: Family Medicine

## 2010-12-11 ENCOUNTER — Encounter: Payer: Self-pay | Admitting: Family Medicine

## 2010-12-11 DIAGNOSIS — L723 Sebaceous cyst: Secondary | ICD-10-CM

## 2010-12-11 DIAGNOSIS — E119 Type 2 diabetes mellitus without complications: Secondary | ICD-10-CM

## 2010-12-11 DIAGNOSIS — L089 Local infection of the skin and subcutaneous tissue, unspecified: Secondary | ICD-10-CM

## 2010-12-11 MED ORDER — CEPHALEXIN 500 MG PO CAPS: 500.0000 mg | ORAL_CAPSULE | Freq: Three times a day (TID) | ORAL | Status: AC

## 2010-12-11 NOTE — Patient Instructions (Addendum)
Make sure getting some regular exercise.

## 2010-12-11 NOTE — Progress Notes (Signed)
  Subjective:    Patient ID: Joshua Scott, male    DOB: [birth year], 76 y.o.   MRN: [id number]  Diabetes He presents for his follow-up diabetic visit. He has type 2 diabetes mellitus. His disease course has been stable. There are no hypoglycemic associated symptoms. Pertinent negatives for diabetes include no blurred vision, no chest pain, no foot paresthesias, no polydipsia, no polyphagia, no polyuria and no weight loss. Current diabetic treatment includes insulin injections. He is compliant with treatment all of the time. He participates in exercise intermittently. There is no change in his home blood glucose trend. An ACE inhibitor/angiotensin II receptor blocker is being taken. Eye exam is current.    He also wants me to look a lesion on his back that he noticed this morning after he got the shower. He said it started draining. He had not noticed it previously. He does have some tenderness today. No fevers. His wife is here with him today.  Review of Systems  Constitutional: Negative for weight loss.  Eyes: Negative for blurred vision.  Cardiovascular: Negative for chest pain.  Genitourinary: Negative for polyuria.  Hematological: Negative for polydipsia and polyphagia.   BP 144/85  Pulse 91  Wt 243 lb (110.224 kg)    No Known Allergies  No past medical history on file.  No past surgical history on file.  History   Social History  . Marital Status: Married    Spouse Name: N/A    Number of Children: N/A  . Years of Education: N/A   Occupational History  . Not on file.   Social History Main Topics  . Smoking status: Former Smoker    Quit date: 01/23/1984  . Smokeless tobacco: Not on file  . Alcohol Use: No  . Drug Use: No  . Sexually Active:    Other Topics Concern  . Not on file   Social History Narrative  . No narrative on file    Family History  Problem Relation Age of Onset  . Diabetes Mother        Objective:   Physical Exam  Constitutional: He is  oriented to person, place, and time. He appears well-developed.  HENT:  Head: Normocephalic and atraumatic.  Cardiovascular: Normal rate, regular rhythm and normal heart sounds.   Pulmonary/Chest: Effort normal and breath sounds normal.  Musculoskeletal: He exhibits no edema.  Neurological: He is alert and oriented to person, place, and time.  Skin: Skin is warm and dry.       He has an inflamed sebaceous cyst is draining on his left upper shoulder. Overall fairly small at about half a centimeter in size. I was able to express most of the contents. There is some surrounding erythema. No streaking.  Psychiatric: He has a normal mood and affect. His behavior is normal.          Assessment & Plan:  DM- Dec lantus to 10 units. F.U in 3 months. Vaccines are up to date. Work on more regular exercise. The cardiothymic rate.  Sebaceous cyst on post left shoulder, infected - Start keflex. Call if not better in one week. I explained the nature of sebaceous cyst and often times these have to be excised to completely remove them.

## 2011-01-15 ENCOUNTER — Other Ambulatory Visit: Payer: Self-pay | Admitting: Family Medicine

## 2011-01-31 ENCOUNTER — Other Ambulatory Visit: Payer: Self-pay | Admitting: *Deleted

## 2011-01-31 MED ORDER — TAMSULOSIN HCL 0.4 MG PO CAPS: 0.4000 mg | ORAL_CAPSULE | Freq: Every day | ORAL | Status: DC

## 2011-02-01 ENCOUNTER — Other Ambulatory Visit: Payer: Self-pay | Admitting: *Deleted

## 2011-02-01 MED ORDER — TAMSULOSIN HCL 0.4 MG PO CAPS: 0.4000 mg | ORAL_CAPSULE | Freq: Every day | ORAL | Status: DC

## 2011-02-01 MED ORDER — NIACIN ER (ANTIHYPERLIPIDEMIC) 500 MG PO TBCR: 500.0000 mg | EXTENDED_RELEASE_TABLET | Freq: Every day | ORAL | Status: DC

## 2011-03-07 ENCOUNTER — Encounter: Payer: Self-pay | Admitting: *Deleted

## 2011-03-13 ENCOUNTER — Encounter: Payer: Self-pay | Admitting: Family Medicine

## 2011-03-13 ENCOUNTER — Ambulatory Visit (INDEPENDENT_AMBULATORY_CARE_PROVIDER_SITE_OTHER): Payer: Medicare Other | Admitting: Family Medicine

## 2011-03-13 DIAGNOSIS — I10 Essential (primary) hypertension: Secondary | ICD-10-CM | POA: Diagnosis not present

## 2011-03-13 DIAGNOSIS — E119 Type 2 diabetes mellitus without complications: Secondary | ICD-10-CM

## 2011-03-13 LAB — POCT UA - MICROALBUMIN

## 2011-03-13 LAB — POCT GLYCOSYLATED HEMOGLOBIN (HGB A1C): Hemoglobin A1C: 5.7

## 2011-03-13 NOTE — Progress Notes (Signed)
  Subjective:    Patient ID: Joshua Scott, male    DOB: 05/03/1934, 77 y.o.   MRN: [id number]  Diabetes He presents for his follow-up diabetic visit. His disease course has been stable. There are no hypoglycemic associated symptoms. There are no diabetic associated symptoms. Pertinent negatives for diabetes include no chest pain. Symptoms are stable. When asked about meal planning, he reported none. He participates in exercise intermittently. An ACE inhibitor/angiotensin II receptor blocker is being taken.  Hypertension This is a chronic problem. The current episode started today. The problem is unchanged. The problem is controlled. Pertinent negatives include no chest pain or shortness of breath. Agents associated with hypertension include NSAIDs. Past treatments include ACE inhibitors. The current treatment provides mild improvement. There are no compliance problems.       Review of Systems  Respiratory: Negative for shortness of breath.   Cardiovascular: Negative for chest pain.       Objective:   Physical Exam  Constitutional: He is oriented to person, place, and time. He appears well-developed and well-nourished.  HENT:  Head: Normocephalic and atraumatic.  Cardiovascular: Normal rate, regular rhythm and normal heart sounds.   Pulmonary/Chest: Effort normal and breath sounds normal.  Neurological: He is alert and oriented to person, place, and time.  Skin: Skin is warm and dry.  Psychiatric: He has a normal mood and affect. His behavior is normal.          Assessment & Plan:  DM- very well-controlled. He can decrease his insulin to 5 units. Followup in 3-4 months. If he still doing well at that point, we'll probably stop his insulin. I encouraged him to not eat too much ice cream which she has been doing lately and to get some more regular exercise. He has had some vision changes I asked him to follow with up, dressed now that his sugars are well regulated. In fact his sugars  seem better controlled, might be the change in his vision.  Hypertension-blood pressure is very well-controlled. Continue current regimen followup in 6 months. He is due for BMP today.

## 2011-03-14 LAB — BASIC METABOLIC PANEL
Calcium: 9.1 mg/dL (ref 8.4–10.5)
Potassium: 4.6 mEq/L (ref 3.5–5.3)
Sodium: 142 mEq/L (ref 135–145)

## 2011-03-19 ENCOUNTER — Other Ambulatory Visit: Payer: Self-pay | Admitting: Family Medicine

## 2011-03-26 ENCOUNTER — Other Ambulatory Visit: Payer: Self-pay | Admitting: *Deleted

## 2011-03-26 MED ORDER — NIACIN ER (ANTIHYPERLIPIDEMIC) 500 MG PO TBCR: 500.0000 mg | EXTENDED_RELEASE_TABLET | Freq: Every day | ORAL | Status: DC

## 2011-04-17 DIAGNOSIS — H2510 Age-related nuclear cataract, unspecified eye: Secondary | ICD-10-CM | POA: Diagnosis not present

## 2011-04-19 ENCOUNTER — Encounter: Payer: Self-pay | Admitting: Family Medicine

## 2011-04-19 ENCOUNTER — Ambulatory Visit (INDEPENDENT_AMBULATORY_CARE_PROVIDER_SITE_OTHER): Payer: Medicare Other | Admitting: Family Medicine

## 2011-04-19 VITALS — BP 130/84 | HR 108 | Temp 98.4°F | Ht 74.0 in | Wt 246.0 lb

## 2011-04-19 DIAGNOSIS — L02219 Cutaneous abscess of trunk, unspecified: Secondary | ICD-10-CM | POA: Diagnosis not present

## 2011-04-19 DIAGNOSIS — L03319 Cellulitis of trunk, unspecified: Secondary | ICD-10-CM | POA: Diagnosis not present

## 2011-04-19 DIAGNOSIS — L02222 Furuncle of back [any part, except buttock]: Secondary | ICD-10-CM

## 2011-04-19 DIAGNOSIS — L089 Local infection of the skin and subcutaneous tissue, unspecified: Secondary | ICD-10-CM

## 2011-04-19 DIAGNOSIS — L723 Sebaceous cyst: Secondary | ICD-10-CM

## 2011-04-19 MED ORDER — HYDROCODONE-ACETAMINOPHEN 5-325 MG PO TABS: 1.0000 | ORAL_TABLET | Freq: Three times a day (TID) | ORAL | Status: AC | PRN

## 2011-04-19 MED ORDER — MUPIROCIN 2 % EX OINT: TOPICAL_OINTMENT | CUTANEOUS | Status: DC

## 2011-04-19 MED ORDER — DOXYCYCLINE HYCLATE 100 MG PO TABS: 100.0000 mg | ORAL_TABLET | Freq: Two times a day (BID) | ORAL | Status: AC

## 2011-04-19 NOTE — Patient Instructions (Addendum)
I&D . Get packing removed Saturday AM at Urgent Care.Epidermal Cyst An epidermal cyst is sometimes called a sebaceous cyst, epidermal inclusion cyst, or infundibular cyst. These cysts usually contain a substance that looks "pasty" or "cheesy" and may have a bad smell. This substance is a protein called keratin. Epidermal cysts are usually found on the face, neck, or trunk. They may also occur in the vaginal area or other parts of the genitalia of both men and women. Epidermal cysts are usually small, painless, slow-growing bumps or lumps that move freely under the skin. It is important not to try to pop them. This may cause an infection and lead to tenderness and swelling. CAUSES  Epidermal cysts may be caused by a deep penetrating injury to the skin or a plugged hair follicle, often associated with acne. SYMPTOMS  Epidermal cysts can become inflamed and cause:  Redness.   Tenderness.   Increased temperature of the skin over the bumps or lumps.   Grayish-white, bad smelling material that drains from the bump or lump.  DIAGNOSIS  Epidermal cysts are easily diagnosed by your caregiver during an exam. Rarely, a tissue sample (biopsy) may be taken to rule out other conditions that may resemble epidermal cysts. TREATMENT   Epidermal cysts often get better and disappear on their own. They are rarely ever cancerous.   If a cyst becomes infected, it may become inflamed and tender. This may require opening and draining the cyst. Treatment with antibiotics may be necessary. When the infection is gone, the cyst may be removed with minor surgery.   Small, inflamed cysts can often be treated with antibiotics or by injecting steroid medicines.   Sometimes, epidermal cysts become large and bothersome. If this happens, surgical removal in your caregiver's office may be necessary.  HOME CARE INSTRUCTIONS  Only take over-the-counter or prescription medicines as directed by your caregiver.   Take your  antibiotics as directed. Finish them even if you start to feel better.  SEEK MEDICAL CARE IF:   Your cyst becomes tender, red, or swollen.   Your condition is not improving or is getting worse.   You have any other questions or concerns.  MAKE SURE YOU:  Understand these instructions.   Will watch your condition.   Will get help right away if you are not doing well or get worse.  Document Released: 12/10/2003 Document Revised: 12/28/2010 Document Reviewed: 07/17/2010 [facility] Patient Information 2012 [place], [place].Incision and Drainage Incision and drainage (I&D) is a procedure in which a cavity-like structure (cystic structure) is opened and drained. The cyst to be drained usually contains material such as pus, fluid, or blood. Gauze is sometimes packed into the cut (incision). Keeping a drain or piece of gauze in the incision keeps the skin from healing first. This helps stop the cyst from forming again. HOME CARE INSTRUCTIONS   Only take over-the-counter or prescription medicines for pain, discomfort, or fever as directed by your caregiver. Use these only if your caregiver has not given medicines that would interfere.   See your caregiver as directed for a recheck.   If medicines (antibiotics) that kill germs were prescribed, take them as directed.  SEEK MEDICAL CARE IF:   You develop increased pain, swelling, redness, drainage, or bleeding in the wound.   You develop signs of an infection. These signs include muscle aches, chills, or a general ill feeling.   You have a fever.  MAKE SURE YOU:   Understand these instructions.   Will watch  your condition.   Will get help right away if you are not doing well or get worse.  Document Released: 07/04/2000 Document Revised: 12/28/2010 Document Reviewed: 08/29/2007 [facility] Patient Information 2012 [place], [place].

## 2011-04-19 NOTE — Progress Notes (Signed)
  Subjective:    Patient ID: Joshua Scott, male    DOB: 1935/01/22, 77 y.o.   MRN: [id number]  HPI Patient is here for a boil on his back. He states he first noticed it on Monday. There was no early warning of the boil occurring. No clear-cut etiology of the cause. The area is warm painful the patient has not been running a fever at this time. He does report having a previous boil that required I&D by Dr. Eppie Scott. No other visible   Review of Systems  Constitutional: Negative for fever.  Respiratory: Negative.   Cardiovascular: Negative.   Genitourinary: Negative.   All other systems reviewed and are negative.      BP 130/84  Pulse 108  Temp(Src) 98.4 F (36.9 C) (Oral)  Ht 6\' 2"  (1.88 m)  Wt 246 lb (111.585 kg)  BMI 31.58 kg/m2  SpO2 95% Objective:   Physical Exam  Constitutional: He is oriented to person, place, and time. He appears well-developed and well-nourished.  HENT:  Head: Normocephalic.  Neurological: He is alert and oriented to person, place, and time. He has normal reflexes.  Skin: Skin is warm. Rash noted. There is erythema.       Boil on L back swollen and  hyperemic  Psychiatric: He has a normal mood and affect. His behavior is normal.   should be noted the abscess was about 23 cm x 13 with a previous site with drainage in the site that was most hyperemetic and fluctuant.   Assessment & Plan:  Boil/infected sebaceous cyst.   note of I&D.  The abscess was about 23 cm by about 13 cm. At the medial superior and there was a scab where material had been draining at the inferior lateral border was where most of the ecchymosis and fluctuance was noted. 2 incisions were made the first one inferior and lateral of the site of drainage because it was fluctuant there and more hyperemic. He first told 1% lidocaine was administered after both areas was cleaned with Betadine. But when pressure was applied there almost all of the pus and sebaceous material was extruded from the  site. SI was also administered lidocaine 1% decision was made her in law is copious amounts of pus is based material extrude. The site was irrigated and then packed and bandaged. Wife was instructed to the pressure dressing on until Saturday morning and [facility] to have a packing removed and start using Bactroban ointment 3 times a day for the next 10 days and if they don't need to repack the wound to follow with me in one week.

## 2011-04-21 ENCOUNTER — Telehealth: Payer: Self-pay | Admitting: Emergency Medicine

## 2011-04-21 ENCOUNTER — Emergency Department (INDEPENDENT_AMBULATORY_CARE_PROVIDER_SITE_OTHER)
Admission: EM | Admit: 2011-04-21 | Discharge: 2011-04-21 | Disposition: A | Discharge status: Home / Self Care | Payer: Medicare Other | Source: Home / Self Care

## 2011-04-21 DIAGNOSIS — IMO0001 Reserved for inherently not codable concepts without codable children: Secondary | ICD-10-CM

## 2011-04-21 DIAGNOSIS — L02219 Cutaneous abscess of trunk, unspecified: Secondary | ICD-10-CM | POA: Diagnosis not present

## 2011-04-21 DIAGNOSIS — Z4800 Encounter for change or removal of nonsurgical wound dressing: Secondary | ICD-10-CM

## 2011-04-21 DIAGNOSIS — L03319 Cellulitis of trunk, unspecified: Secondary | ICD-10-CM

## 2011-04-21 NOTE — Discharge Instructions (Signed)
Dressing Change   Dressings are placed over wounds to keep them clean, dry, and protected from further injury. This provides an environment that favors wound healing. Good wound care includes resting and elevating the injured part until the pain and swelling are better. Change your wound dressing as recommended by your caregiver.   When removing an old dressing, lift it slowly away from the wound. If the dressing sticks to the wound, dampen it with half-strength peroxide or tap water. Clean the wound gently with a moist cloth, remove any loose material, and apply antibiotic ointment if recommended by your caregiver. Usually it is okay for a wound to get wet. Wash it with mildly soapy water. Watch for signs of infection when changing a dressing.   SEEK MEDICAL CARE IF:   You develop increased pain, redness, or swelling.   You have pus-like drainage from the wound.   You develop a fever greater than 100.4 F (38 C).   Document Released: 02/16/2004 Document Revised: 12/28/2010 Document Reviewed: 11/20/2010   ExitCare Patient Information 2012 [employer].

## 2011-04-21 NOTE — ED Notes (Addendum)
Patient presents for follow up wound check. Patient had a cyst drained and packed on 3/28/20013 by Hassan Rowan, MD. He was given doxycycline 100 mg to take twice daily.

## 2011-04-21 NOTE — ED Provider Notes (Signed)
History     CSN: [id number]  Arrival date & time 04/21/11  1100   None     Chief Complaint  Patient presents with  . Wound Check    thursday wound was drained and packed    (Consider location/radiation/quality/duration/timing/severity/associated sxs/prior treatment) Patient is a 77 y.o. male presenting with wound check.  Wound Check   Patient/wife reports abscess to left mid back was I&D with packing 2 days ago, instructed to have dressing changed in two days by health care provider.  Currently on Doxycyline as previously prescribed.  No noted fever.  Small amount of bloody wound drainage, + redness, improved swelling, no pain at site.    Past Medical History  Diagnosis Date  . Hyperlipidemia   . Diabetes mellitus   . Hypertension   . Gout     History reviewed. No pertinent past surgical history.  Family History  Problem Relation Age of Onset  . Diabetes Mother     History  Substance Use Topics  . Smoking status: Former Smoker    Quit date: 01/23/1984  . Smokeless tobacco: Not on file  . Alcohol Use: No      Review of Systems  All other systems reviewed and are negative.    Allergies  Review of patient's allergies indicates no known allergies.  Home Medications   Current Outpatient Rx  Name Route Sig Dispense Refill  . ALLOPURINOL 300 MG PO TABS  TAKE 1 TABLET BY MOUTH ONCE A DAY 90 tablet 2  . DOXYCYCLINE HYCLATE 100 MG PO TABS Oral Take 1 tablet (100 mg total) by mouth 2 (two) times daily. 28 tablet 0  . HYDROCODONE-ACETAMINOPHEN 5-325 MG PO TABS Oral Take 1 tablet by mouth every 8 (eight) hours as needed for pain. 20 tablet 0  . INSULIN GLARGINE 100 UNIT/ML [place] SOLN Subcutaneous Inject 5 Units into the skin daily.     Marland Kitchen MUPIROCIN 2 % EX OINT  Apply to affected area 3 times daily 22 g 0  . NEEDLES & SYRINGES MISC       . NIACIN ER (ANTIHYPERLIPIDEMIC) 500 MG PO TBCR Oral Take 1 tablet (500 mg total) by mouth at bedtime. 90 tablet 1  . SIMVASTATIN 20  MG PO TABS  TAKE 1 TABLET BY MOUTH ONCE A DAY 90 tablet 2  . TAMSULOSIN HCL 0.4 MG PO CAPS Oral Take 1 capsule (0.4 mg total) by mouth daily. 90 capsule 0  . VALSARTAN 320 MG PO TABS Oral Take 1 tablet (320 mg total) by mouth daily. 90 tablet 1    BP 118/79  Pulse 105  Temp(Src) 98.5 F (36.9 C) (Oral)  Resp 18  Ht 6\' 5"  (1.956 m)  Wt 245 lb (111.131 kg)  BMI 29.05 kg/m2  SpO2 94%  Physical Exam  Constitutional: He is oriented to person, place, and time. Vital signs are normal. He appears well-developed and well-nourished. He is active and cooperative.  HENT:  Head: Normocephalic.  Eyes: Conjunctivae are normal. Pupils are equal, round, and reactive to light. No scleral icterus.  Neck: Trachea normal. Neck supple.  Cardiovascular: Normal rate and regular rhythm.   Pulmonary/Chest: Effort normal and breath sounds normal.  Neurological: He is alert and oriented to person, place, and time. No cranial nerve deficit or sensory deficit.  Skin: Skin is warm and dry. Lesion noted. There is erythema.     Psychiatric: He has a normal mood and affect. His speech is normal and behavior is normal. Judgment and thought  content normal. Cognition and memory are normal.    ED Course  Wound packing Performed by: Belva Koziel L Authorized by: Lannie Fields L Consent: Verbal consent obtained. Risks and benefits: risks, benefits and alternatives were discussed Consent given by: patient Patient understanding: patient states understanding of the procedure being performed Patient consent: the patient's understanding of the procedure matches consent given Patient identity confirmed: verbally with patient Time out: Immediately prior to procedure a "time out" was called to verify the correct patient, procedure, equipment, support staff and site/side marked as required. Local anesthesia used: no Patient tolerance: Patient tolerated the procedure well with no immediate complications. Comments:  Right abscess cleaned with NS, packed with Iodoform packing, covered with nonadherent dressing, secured with paper tape.    Labs Reviewed - No data to display No results found.  Preliminary culture results reviewed, no change in antibiotics.   1. Wound check, dressing change       MDM  Continue antibiotics as prescribed.  Change outer dressing daily, careful not to remove packing for two days.  Follow up with Dr. Thurmond Butts as instructed on Thursday.          Johnsie Kindred, NP 04/21/11 1436

## 2011-04-23 NOTE — ED Provider Notes (Signed)
Agree with exam, assessment, and plan.   Lattie Haw, MD 04/23/11 [phone]

## 2011-04-26 ENCOUNTER — Encounter: Payer: Self-pay | Admitting: Family Medicine

## 2011-04-26 ENCOUNTER — Ambulatory Visit (INDEPENDENT_AMBULATORY_CARE_PROVIDER_SITE_OTHER): Payer: Medicare Other | Admitting: Family Medicine

## 2011-04-26 VITALS — BP 140/93 | HR 97 | Ht 77.0 in | Wt 244.0 lb

## 2011-04-26 DIAGNOSIS — T8149XA Infection following a procedure, other surgical site, initial encounter: Secondary | ICD-10-CM

## 2011-04-26 DIAGNOSIS — L02219 Cutaneous abscess of trunk, unspecified: Secondary | ICD-10-CM

## 2011-04-26 MED ORDER — MUPIROCIN 2 % EX OINT: TOPICAL_OINTMENT | CUTANEOUS | Status: AC

## 2011-04-26 NOTE — Progress Notes (Deleted)
Patient ID: Joshua Scott, male   DOB: Apr 14, 1934, 77 y.o.   MRN: [id number]

## 2011-04-26 NOTE — Progress Notes (Signed)
Patient is here for followup of abscess. He was seen in urgent care over the weekend where the drain was removed and repacked. The patient is wide removed the subsequent packing. There are 2 hold and the left back draining. The largest superior and closer to the vertebra and drinking well this is the site that was packed. The other hole or incision site was not packed there is minimal drainage from that but the only reason why is draining now is because of the  location inferior to the larger part of abscess gravity is now helping it drain.   Assessment and plan  Renew Bactroban ointment continue 3 times a day. May go without bandages providing drainage is not too bad.  Return for followup when necessary  Followup with Dr. Linford Arnold for routine care.

## 2011-04-26 NOTE — Progress Notes (Deleted)
  Subjective:    Patient ID: Joshua Scott, male    DOB: 1934/08/03, 77 y.o.   MRN: [id number]  HPI    Review of Systems     Objective:   Physical Exam        Assessment & Plan:

## 2011-04-26 NOTE — Patient Instructions (Signed)
Incision and Drainage of Abscess An abscess (boil or furuncle) is an area infected by germs that contains a collection of pus. Signs and problems (symptoms) of an abscess include pain, tenderness, redness, or hardness. You may feel a moveable, soft area under your skin. An abscess can occur anywhere in the body. Occasionally, this may spread to surrounding tissues causing cellulitis. Sometimes, a surgeon may make a cut (incision) over your abscess. The pus is drained. Gauze may be packed into the space to provide a drain. Keeping a drain or piece of gauze in the incision keeps the skin from healing first. This helps stop the abscess from forming again. The area may be painful for 5 to 7 days. Most people with an abscess do not have high fevers. If seen early, your abscess may not have localized and may not be cut. If it does not get better on its own or with medicines, you may require another appointment. HOME CARE INSTRUCTIONS   Only take over-the-counter or prescription medicines for pain, discomfort, or fever as directed by your caregiver. Use these only if your caregiver has not given medicines that would interfere.   When you bathe, remove the gauze drain after soaking. You may then wash the wound gently with mild, soapy water. Repack with gauze as your caregiver directs.   See your caregiver as directed for a recheck.   If antibiotics were prescribed, take them as directed.  SEEK MEDICAL CARE IF:   You develop increased pain, swelling, redness, drainage, or bleeding in the wound site.   You develop signs of generalized infection, including muscle aches, chills, or a general ill feeling.   You or your child has an oral temperature above 102 F (38.9 C).  MAKE SURE YOU:   Understand these instructions.   Will watch your condition.   Will get help right away if you are not doing well or get worse.  Document Released: 07/04/2000 Document Revised: 09/20/2010 Document Reviewed:  08/29/2007 Exit[employer] Patient Information 2012 [employer]. 

## 2011-05-03 LAB — WOUND CULTURE
Gram Stain: NONE SEEN
Organism ID, Bacteria: UNDETERMINED

## 2011-05-21 DIAGNOSIS — H526 Other disorders of refraction: Secondary | ICD-10-CM | POA: Diagnosis not present

## 2011-05-21 DIAGNOSIS — H2510 Age-related nuclear cataract, unspecified eye: Secondary | ICD-10-CM | POA: Diagnosis not present

## 2011-05-21 DIAGNOSIS — E119 Type 2 diabetes mellitus without complications: Secondary | ICD-10-CM | POA: Diagnosis not present

## 2011-06-08 ENCOUNTER — Encounter: Payer: Self-pay | Admitting: *Deleted

## 2011-06-08 DIAGNOSIS — H2510 Age-related nuclear cataract, unspecified eye: Secondary | ICD-10-CM | POA: Diagnosis not present

## 2011-06-08 DIAGNOSIS — Z01818 Encounter for other preprocedural examination: Secondary | ICD-10-CM | POA: Diagnosis not present

## 2011-06-11 ENCOUNTER — Ambulatory Visit (INDEPENDENT_AMBULATORY_CARE_PROVIDER_SITE_OTHER): Payer: Medicare Other | Admitting: Family Medicine

## 2011-06-11 ENCOUNTER — Encounter: Payer: Self-pay | Admitting: Family Medicine

## 2011-06-11 DIAGNOSIS — N183 Chronic kidney disease, stage 3 unspecified: Secondary | ICD-10-CM

## 2011-06-11 DIAGNOSIS — N529 Male erectile dysfunction, unspecified: Secondary | ICD-10-CM

## 2011-06-11 DIAGNOSIS — N400 Enlarged prostate without lower urinary tract symptoms: Secondary | ICD-10-CM | POA: Diagnosis not present

## 2011-06-11 DIAGNOSIS — E119 Type 2 diabetes mellitus without complications: Secondary | ICD-10-CM | POA: Diagnosis not present

## 2011-06-11 MED ORDER — GLIPIZIDE ER 2.5 MG PO TB24: 2.5000 mg | ORAL_TABLET | Freq: Every day | ORAL | Status: DC

## 2011-06-11 MED ORDER — TADALAFIL 5 MG PO TABS: 5.0000 mg | ORAL_TABLET | Freq: Every day | ORAL | Status: DC | PRN

## 2011-06-11 NOTE — Patient Instructions (Signed)
Increase lantus to 10 units until get new pill in.

## 2011-06-11 NOTE — Progress Notes (Signed)
  Subjective:    Patient ID: Joshua Scott, male    DOB: 10-17-1934, 77 y.o.   MRN: [id number]  Diabetes He presents for his follow-up diabetic visit. He has type 2 diabetes mellitus. His disease course has been stable. There are no hypoglycemic associated symptoms. There are no diabetic associated symptoms. There are no hypoglycemic complications. Symptoms are stable. He is compliant with treatment all of the time. He has not had a previous visit with a dietician. He rarely participates in exercise. His breakfast blood glucose range is generally 130-140 mg/dl. Eye exam is current.   ED he would like to try medication for his erectile dysfunction. Most interested in Viagra and Cialis. He's also interested in the once daily dosing of the Cialis. His wife of 54 years is here with him today.  BPH-he is still taking his meds.     Review of Systems     Objective:   Physical Exam  Constitutional: He is oriented to person, place, and time. He appears well-developed and well-nourished.  HENT:  Head: Normocephalic and atraumatic.  Cardiovascular: Normal rate, regular rhythm and normal heart sounds.        No carotid bruits.  Pulmonary/Chest: Effort normal and breath sounds normal.  Musculoskeletal: He exhibits no edema.  Neurological: He is alert and oriented to person, place, and time.  Skin: Skin is warm and dry.  Psychiatric: He has a normal mood and affect. His behavior is normal.          Assessment & Plan:  DM- controlled. We will stop the Lantus and switch him to glipizide. Followup in 3 months to make sure staying well controlled. His fastings are not staying under 130 on the 2.5 of glipizide then please call the office sooner rather than waiting for followup appointment. Lab Results  Component Value Date   HGBA1C 6.4 06/11/2011   Benign prostatic hypertrophy. He's currently on tamsulosin. His AUA score was 16, moderate.  He is Re: on tamsulosin. We will also try the low-dose Cialis  and see if this improves his symptoms as well. He still has some urinary frequency as well as difficulty postponing urination.  ED - he would like to try an ED drug. His wife of 54 years is here with him today. He has never tried one. He says he is most interested in the low dose daily Cialis. Given samples and a coupon card for Cialis 5 mg daily.ED score of 1, very low.  I did not have him complete a testosterone questionnaire at this time.

## 2011-06-14 ENCOUNTER — Encounter: Payer: Self-pay | Admitting: Family Medicine

## 2011-06-14 DIAGNOSIS — H2510 Age-related nuclear cataract, unspecified eye: Secondary | ICD-10-CM | POA: Diagnosis not present

## 2011-06-14 DIAGNOSIS — H269 Unspecified cataract: Secondary | ICD-10-CM | POA: Diagnosis not present

## 2011-06-14 DIAGNOSIS — E785 Hyperlipidemia, unspecified: Secondary | ICD-10-CM | POA: Diagnosis not present

## 2011-06-14 DIAGNOSIS — Z794 Long term (current) use of insulin: Secondary | ICD-10-CM | POA: Diagnosis not present

## 2011-06-14 DIAGNOSIS — I10 Essential (primary) hypertension: Secondary | ICD-10-CM | POA: Diagnosis not present

## 2011-06-14 DIAGNOSIS — Z79899 Other long term (current) drug therapy: Secondary | ICD-10-CM | POA: Diagnosis not present

## 2011-06-14 DIAGNOSIS — E119 Type 2 diabetes mellitus without complications: Secondary | ICD-10-CM | POA: Diagnosis not present

## 2011-06-14 HISTORY — PX: CATARACT EXTRACTION W/ INTRAOCULAR LENS IMPLANT: SHX1309

## 2011-07-03 DIAGNOSIS — IMO0002 Reserved for concepts with insufficient information to code with codable children: Secondary | ICD-10-CM | POA: Diagnosis not present

## 2011-07-03 DIAGNOSIS — H2510 Age-related nuclear cataract, unspecified eye: Secondary | ICD-10-CM | POA: Diagnosis not present

## 2011-07-03 HISTORY — PX: CATARACT EXTRACTION W/ INTRAOCULAR LENS IMPLANT: SHX1309

## 2011-08-02 ENCOUNTER — Other Ambulatory Visit: Payer: Self-pay | Admitting: Family Medicine

## 2011-09-03 ENCOUNTER — Other Ambulatory Visit: Payer: Self-pay | Admitting: Family Medicine

## 2011-09-03 MED ORDER — VALSARTAN 320 MG PO TABS: 320.0000 mg | ORAL_TABLET | Freq: Every day | ORAL | Status: DC

## 2011-09-11 ENCOUNTER — Encounter: Payer: Self-pay | Admitting: Family Medicine

## 2011-09-11 ENCOUNTER — Ambulatory Visit (INDEPENDENT_AMBULATORY_CARE_PROVIDER_SITE_OTHER): Payer: Medicare Other | Admitting: Family Medicine

## 2011-09-11 VITALS — BP 122/88 | HR 79 | Wt 248.0 lb

## 2011-09-11 DIAGNOSIS — Z1331 Encounter for screening for depression: Secondary | ICD-10-CM

## 2011-09-11 DIAGNOSIS — Z9181 History of falling: Secondary | ICD-10-CM

## 2011-09-11 DIAGNOSIS — E785 Hyperlipidemia, unspecified: Secondary | ICD-10-CM | POA: Diagnosis not present

## 2011-09-11 DIAGNOSIS — E119 Type 2 diabetes mellitus without complications: Secondary | ICD-10-CM | POA: Diagnosis not present

## 2011-09-11 NOTE — Progress Notes (Addendum)
  Subjective:    Patient ID: Joshua Scott, male    DOB: 24-Sep-1934, 77 y.o.   MRN: [id number]  HPI Diabetes-we stopped his Lantus at last office visit and started glipizide. We were hoping to get him off of insulin completely. He is due for a followup A1c today. He is also due for monofilament exam.  Home fasting sugars between 117-187. Has gained a few lbs. No hypoglycemic events.  No regular exercise. He says he has made some changes with his diet but again.   Review of Systems     Objective:   Physical Exam  Constitutional: He is oriented to person, place, and time. He appears well-developed and well-nourished.  HENT:  Head: Normocephalic and atraumatic.  Cardiovascular: Normal rate, regular rhythm and normal heart sounds.   Pulmonary/Chest: Effort normal and breath sounds normal.  Neurological: He is alert and oriented to person, place, and time.  Skin: Skin is warm and dry.  Psychiatric: He has a normal mood and affect. His behavior is normal.          Assessment & Plan:  Diabetes mellitus-  Well controlled.  A1C is 5.8. F/U in 3 monthhs. Th. Work on diet and exercise. Monitor weight as he has gained 3 lbs. not only exam performed today. Blood pressure slightly elevated today. Followup in 3 months. A1c looks fantastic. Monitor for hypoglycemic event especially with the glipizide. I want him his wife about this. He says he is actually never had a low sugar. Lab Results  Component Value Date   HGBA1C 6.4 06/11/2011    Hyperlipidemia-due for CMP and fasting lipid panel.  CKD-3  - due to recheck kidney function.   Fall Assessment - Score of 2, low risk  Deprssion Screen - PHQ- 9 score of 0.  Neg for depression.

## 2011-09-12 ENCOUNTER — Other Ambulatory Visit: Payer: Self-pay | Admitting: Family Medicine

## 2011-09-17 DIAGNOSIS — I10 Essential (primary) hypertension: Secondary | ICD-10-CM | POA: Diagnosis not present

## 2011-09-17 DIAGNOSIS — E785 Hyperlipidemia, unspecified: Secondary | ICD-10-CM | POA: Diagnosis not present

## 2011-09-17 DIAGNOSIS — E871 Hypo-osmolality and hyponatremia: Secondary | ICD-10-CM | POA: Diagnosis not present

## 2011-09-17 DIAGNOSIS — R809 Proteinuria, unspecified: Secondary | ICD-10-CM | POA: Diagnosis not present

## 2011-10-02 DIAGNOSIS — E119 Type 2 diabetes mellitus without complications: Secondary | ICD-10-CM | POA: Diagnosis not present

## 2011-10-03 LAB — COMPLETE METABOLIC PANEL WITH GFR
ALT: 11 U/L (ref 0–53)
AST: 11 U/L (ref 0–37)
CO2: 26 mEq/L (ref 19–32)
Creat: 1.14 mg/dL (ref 0.50–1.35)
GFR, Est African American: 72 mL/min
Total Bilirubin: 0.8 mg/dL (ref 0.3–1.2)

## 2011-10-03 LAB — LIPID PANEL
HDL: 33 mg/dL — ABNORMAL LOW (ref >39)
LDL Cholesterol: 59 mg/dL (ref 0–99)
Triglycerides: 106 mg/dL (ref <150)

## 2011-10-08 DIAGNOSIS — I10 Essential (primary) hypertension: Secondary | ICD-10-CM | POA: Diagnosis not present

## 2011-10-08 DIAGNOSIS — E119 Type 2 diabetes mellitus without complications: Secondary | ICD-10-CM | POA: Diagnosis not present

## 2011-10-08 DIAGNOSIS — R809 Proteinuria, unspecified: Secondary | ICD-10-CM | POA: Diagnosis not present

## 2011-10-08 DIAGNOSIS — E785 Hyperlipidemia, unspecified: Secondary | ICD-10-CM | POA: Diagnosis not present

## 2011-11-07 DIAGNOSIS — Z23 Encounter for immunization: Secondary | ICD-10-CM | POA: Diagnosis not present

## 2011-11-13 ENCOUNTER — Other Ambulatory Visit: Payer: Self-pay | Admitting: Family Medicine

## 2011-11-24 ENCOUNTER — Other Ambulatory Visit: Payer: Self-pay | Admitting: Family Medicine

## 2011-12-13 ENCOUNTER — Ambulatory Visit: Payer: Medicare Other | Admitting: Family Medicine

## 2011-12-17 ENCOUNTER — Encounter: Payer: Self-pay | Admitting: Family Medicine

## 2011-12-17 ENCOUNTER — Ambulatory Visit (INDEPENDENT_AMBULATORY_CARE_PROVIDER_SITE_OTHER): Payer: Medicare Other | Admitting: Family Medicine

## 2011-12-17 DIAGNOSIS — E119 Type 2 diabetes mellitus without complications: Secondary | ICD-10-CM | POA: Diagnosis not present

## 2011-12-17 DIAGNOSIS — E785 Hyperlipidemia, unspecified: Secondary | ICD-10-CM

## 2011-12-17 DIAGNOSIS — I10 Essential (primary) hypertension: Secondary | ICD-10-CM

## 2011-12-17 NOTE — Progress Notes (Signed)
  Subjective:    Patient ID: Emerald Gehres, male    DOB: 1934-07-04, 77 y.o.   MRN: [id number]  HPI DM- No hpoglycemic events.  Off the lantus.  Feels ok on the glipizide. Fasting rasning 120-151. brought in log.  No wounds that are'nt healing well. No polyuria or polydipsia.  HTN - No CP or SOB. Taking medicine every days.   Lipidemia-LDL look so great at last office visit that for me to cut his statin in half. Lab Results  Component Value Date   CHOL 113 09/11/2011   HDL 33* 09/11/2011   LDLCALC 59 09/11/2011   TRIG 106 09/11/2011   CHOLHDL 3.4 09/11/2011     Review of Systems     Objective:   Physical Exam  Constitutional: He is oriented to person, place, and time. He appears well-developed and well-nourished.  HENT:  Head: Normocephalic and atraumatic.  Cardiovascular: Normal rate, regular rhythm and normal heart sounds.        No carotid bruits.   Pulmonary/Chest: Effort normal and breath sounds normal.  Musculoskeletal: He exhibits no edema.  Neurological: He is alert and oriented to person, place, and time.  Skin: Skin is warm and dry.  Psychiatric: He has a normal mood and affect. His behavior is normal.          Assessment & Plan:  DM- well controlled. In fact we did go ahead and stop his glipizide 2.5 extended release at this point. We will just continue oncologic. Followup in 3 months. I think he is doing a fantastic job. Continue to work on diet and exercise. Followup in 3 months.  HTN  - uncontrolled today but we will recheck it before he goes home. His blood pressure looks fantastic 3 months ago in August. If it's still elevated then we'll repeat it again in 3 months if at that point if still elevated then I will adjust his medication.  Hyperlipidema- we cut down his cholesteorl pill 3 mo ago. Do to recheck level. Given lab slip. Encouraged him to go anytime between now and his followup appointment in 3 months.

## 2011-12-17 NOTE — Patient Instructions (Addendum)
He can discontinue the glipizide and just continue the ankle either. Continue work on [employer]. Make sure avoiding salt and continue to get regular exercise.

## 2012-01-10 ENCOUNTER — Other Ambulatory Visit: Payer: Self-pay | Admitting: Family Medicine

## 2012-02-01 DIAGNOSIS — E785 Hyperlipidemia, unspecified: Secondary | ICD-10-CM | POA: Diagnosis not present

## 2012-02-01 LAB — LIPID PANEL
Cholesterol: 141 mg/dL (ref 0–200)
HDL: 31 mg/dL — ABNORMAL LOW (ref >39)
Triglycerides: 134 mg/dL (ref <150)

## 2012-02-02 LAB — COMPLETE METABOLIC PANEL WITH GFR
ALT: 10 U/L (ref 0–53)
BUN: 16 mg/dL (ref 6–23)
CO2: 29 mEq/L (ref 19–32)
Creat: 1.05 mg/dL (ref 0.50–1.35)
GFR, Est African American: 80 mL/min
GFR, Est Non African American: 69 mL/min
Total Bilirubin: 0.8 mg/dL (ref 0.3–1.2)

## 2012-02-06 ENCOUNTER — Other Ambulatory Visit: Payer: Self-pay | Admitting: Family Medicine

## 2012-02-19 ENCOUNTER — Other Ambulatory Visit: Payer: Self-pay | Admitting: Family Medicine

## 2012-02-29 ENCOUNTER — Ambulatory Visit (INDEPENDENT_AMBULATORY_CARE_PROVIDER_SITE_OTHER): Payer: Medicare Other | Admitting: Family Medicine

## 2012-02-29 ENCOUNTER — Encounter: Payer: Self-pay | Admitting: Family Medicine

## 2012-02-29 VITALS — BP 121/81 | HR 101 | Wt 247.0 lb

## 2012-02-29 DIAGNOSIS — E119 Type 2 diabetes mellitus without complications: Secondary | ICD-10-CM

## 2012-02-29 DIAGNOSIS — E785 Hyperlipidemia, unspecified: Secondary | ICD-10-CM | POA: Diagnosis not present

## 2012-02-29 LAB — POCT GLYCOSYLATED HEMOGLOBIN (HGB A1C): Hemoglobin A1C: 6.1

## 2012-02-29 MED ORDER — GLIPIZIDE ER 5 MG PO TB24: 5.0000 mg | ORAL_TABLET | Freq: Every day | ORAL | Status: DC

## 2012-02-29 NOTE — Progress Notes (Signed)
  Subjective:    Patient ID: Joshua Scott, male    DOB: [birth year], 78 y.o.   MRN: [id number]  HPI DM- Suagars have been runing high in the AM recently. Still on glipizide and glipizide.   Pt denies chest pain, SOB, dizziness, or heart palpitations.  Taking meds as directed w/o problems.  Denies medication side effects.  Home sugars are primarily running around 170-180 on average. No hypoglycemic events.  Hyperlipidemia-tolerating his statin well and taking it nightly. He just had blood work done recently and testosterone looked great.    Review of Systems     Objective:   Physical Exam  Constitutional: He is oriented to person, place, and time. He appears well-developed and well-nourished.  HENT:  Head: Normocephalic and atraumatic.  Cardiovascular: Normal rate, regular rhythm and normal heart sounds.   Pulmonary/Chest: Effort normal and breath sounds normal.  Musculoskeletal: He exhibits no edema.  Neurological: He is alert and oriented to person, place, and time.  Skin: Skin is warm and dry.  Psychiatric: He has a normal mood and affect. His behavior is normal.          Assessment & Plan:  DM - Will incrase glipizide to 5mg  dose.  F/U in 3 months. On statin and ARB and baby aspirin. Labs are up-to-date. Preventative care is up-to-date.  Hyperlipidemia-currently well controlled. Continue current regimen.

## 2012-04-22 ENCOUNTER — Other Ambulatory Visit: Payer: Self-pay | Admitting: Family Medicine

## 2012-04-25 ENCOUNTER — Ambulatory Visit (INDEPENDENT_AMBULATORY_CARE_PROVIDER_SITE_OTHER): Payer: Medicare Other | Admitting: Family Medicine

## 2012-04-25 DIAGNOSIS — Z23 Encounter for immunization: Secondary | ICD-10-CM | POA: Insufficient documentation

## 2012-04-25 NOTE — Progress Notes (Signed)
  Subjective:    Patient ID: Joshua Scott, male    DOB: [birth year], 77 y.o.   MRN: [id number]  HPI  Here for Tdap  Review of Systems     Objective:   Physical Exam        Assessment & Plan:

## 2012-05-16 ENCOUNTER — Other Ambulatory Visit: Payer: Self-pay | Admitting: Family Medicine

## 2012-05-16 NOTE — Telephone Encounter (Signed)
Needs to schedule follow up appt in May.

## 2012-05-24 ENCOUNTER — Other Ambulatory Visit: Payer: Self-pay | Admitting: Family Medicine

## 2012-05-29 ENCOUNTER — Ambulatory Visit (INDEPENDENT_AMBULATORY_CARE_PROVIDER_SITE_OTHER): Payer: Medicare Other | Admitting: Family Medicine

## 2012-05-29 ENCOUNTER — Encounter: Payer: Self-pay | Admitting: Family Medicine

## 2012-05-29 DIAGNOSIS — E785 Hyperlipidemia, unspecified: Secondary | ICD-10-CM | POA: Diagnosis not present

## 2012-05-29 DIAGNOSIS — E119 Type 2 diabetes mellitus without complications: Secondary | ICD-10-CM

## 2012-05-29 LAB — POCT UA - MICROALBUMIN
Albumin/Creatinine Ratio, Urine, POC: <30
Creatinine, POC: 100 mg/dL
Microalbumin Ur, POC: 10 mg/dL

## 2012-05-29 NOTE — Progress Notes (Signed)
  Subjective:    Patient ID: Joshua Scott, male    DOB: 02-11-1934, 78 y.o.   MRN: [id number]  HPI DM  - WEll controlled at home. No hypoglycemic events. Walks daily.  NO wounds that aren't healing well.  Says feel feel cold all the time.    Hyperlipidemia -  Lab Results  Component Value Date   CHOL 141 02/01/2012   HDL 31* 02/01/2012   LDLCALC 83 02/01/2012   TRIG 134 02/01/2012   CHOLHDL 4.5 02/01/2012     Review of Systems     Objective:   Physical Exam  Constitutional: He is oriented to person, place, and time. He appears well-developed and well-nourished.  HENT:  Head: Normocephalic and atraumatic.  Cardiovascular: Normal rate, regular rhythm and normal heart sounds.   Pulmonary/Chest: Effort normal and breath sounds normal.  Neurological: He is alert and oriented to person, place, and time.  Skin: Skin is warm and dry.  Psychiatric: He has a normal mood and affect. His behavior is normal.          Assessment & Plan:  DM - Well controlled. F/U in 3 months. Due for CMP at that time.  On ASA, ARB, statin.  Urine mciro and foot exam performed today. He does have some mild abnormalities on his exam so we discussed the importance of checking the bottoms of his feet on a daily basis to look for cuts sores and wanes that he might otherwise not be able to feel.  Lab Results  Component Value Date   HGBA1C 5.6 05/29/2012   Hyperlipidemia - Well controlledin Jan. Continue statin.   Lab Results  Component Value Date   CHOL 141 02/01/2012   HDL 31* 02/01/2012   LDLCALC 83 02/01/2012   TRIG 134 02/01/2012   CHOLHDL 4.5 02/01/2012

## 2012-06-21 ENCOUNTER — Other Ambulatory Visit: Payer: Self-pay | Admitting: Family Medicine

## 2012-07-31 ENCOUNTER — Other Ambulatory Visit: Payer: Self-pay

## 2012-08-28 ENCOUNTER — Other Ambulatory Visit: Payer: Self-pay | Admitting: Family Medicine

## 2012-08-28 NOTE — Telephone Encounter (Signed)
Needs f/u.

## 2012-08-29 ENCOUNTER — Ambulatory Visit (INDEPENDENT_AMBULATORY_CARE_PROVIDER_SITE_OTHER): Payer: Medicare Other | Admitting: Family Medicine

## 2012-08-29 ENCOUNTER — Encounter: Payer: Self-pay | Admitting: Family Medicine

## 2012-08-29 DIAGNOSIS — N183 Chronic kidney disease, stage 3 unspecified: Secondary | ICD-10-CM

## 2012-08-29 DIAGNOSIS — E1129 Type 2 diabetes mellitus with other diabetic kidney complication: Secondary | ICD-10-CM

## 2012-08-29 LAB — POCT GLYCOSYLATED HEMOGLOBIN (HGB A1C): Hemoglobin A1C: 5.7

## 2012-08-29 NOTE — Progress Notes (Signed)
  Subjective:    Patient ID: Joshua Scott, male    DOB: Jun 20, 1934, 78 y.o.   MRN: [id number]  HPI Diabetes-overall is doing well. His blood sugars have been staying stable around 140. Hypoglycemic events. No significant elevations in sugars. No cuts or sores or wounds that are not healing well. He does have his diabetic shoes on today. Taking medications as prescribed.   Review of Systems     Objective:   Physical Exam  Constitutional: He is oriented to person, place, and time. He appears well-developed and well-nourished.  HENT:  Head: Normocephalic and atraumatic.  Cardiovascular: Normal rate, regular rhythm and normal heart sounds.   Pulmonary/Chest: Effort normal and breath sounds normal.  Musculoskeletal: He exhibits no edema.  Neurological: He is alert and oriented to person, place, and time.  Skin: Skin is warm and dry.  Psychiatric: He has a normal mood and affect. His behavior is normal.          Assessment & Plan:  Diabetes-well-controlled. Continue current regimen. Followup in 3 months. Foot exam is up-to-date. Did encourage him to get his eye exam between now and the next time I see him. He is due. His wife recently had surgery so he has not been able to get in and do it. Check BMP to keep an eye on kidney function. On aspirin, ARB, statin.  CKD 3-due to recheck kidney function today. He is on Diovan.

## 2012-08-29 NOTE — Patient Instructions (Signed)
Try to get your eye exam.    

## 2012-08-30 LAB — BASIC METABOLIC PANEL WITH GFR
Calcium: 9.4 mg/dL (ref 8.4–10.5)
GFR, Est African American: 80 mL/min
Glucose, Bld: 94 mg/dL (ref 70–99)
Sodium: 140 mEq/L (ref 135–145)

## 2012-09-24 ENCOUNTER — Other Ambulatory Visit: Payer: Self-pay | Admitting: Family Medicine

## 2012-09-26 ENCOUNTER — Other Ambulatory Visit: Payer: Self-pay | Admitting: *Deleted

## 2012-09-26 MED ORDER — SIMVASTATIN 20 MG PO TABS: ORAL_TABLET | ORAL | Status: DC

## 2012-10-22 ENCOUNTER — Other Ambulatory Visit: Payer: Self-pay | Admitting: Family Medicine

## 2012-10-22 MED ORDER — ALLOPURINOL 300 MG PO TABS: ORAL_TABLET | ORAL | Status: DC

## 2012-10-30 ENCOUNTER — Other Ambulatory Visit: Payer: Self-pay | Admitting: Family Medicine

## 2012-11-06 ENCOUNTER — Other Ambulatory Visit: Payer: Self-pay | Admitting: Family Medicine

## 2012-11-06 MED ORDER — DOXAZOSIN MESYLATE 2 MG PO TABS: 2.0000 mg | ORAL_TABLET | Freq: Every day | ORAL | Status: DC

## 2012-11-06 NOTE — Progress Notes (Signed)
Request sent from pharmacy to replace tamsulosin with another alternative. Evidently they were temporarily out of stock. [facility] for doxazosin 2 mg at bedtime.

## 2012-11-12 DIAGNOSIS — Z23 Encounter for immunization: Secondary | ICD-10-CM | POA: Diagnosis not present

## 2012-11-26 ENCOUNTER — Other Ambulatory Visit: Payer: Self-pay | Admitting: *Deleted

## 2012-11-26 MED ORDER — ALLOPURINOL 300 MG PO TABS: ORAL_TABLET | ORAL | Status: DC

## 2012-12-01 ENCOUNTER — Ambulatory Visit (INDEPENDENT_AMBULATORY_CARE_PROVIDER_SITE_OTHER): Payer: Medicare Other | Admitting: Family Medicine

## 2012-12-01 ENCOUNTER — Encounter: Payer: Self-pay | Admitting: Family Medicine

## 2012-12-01 DIAGNOSIS — E785 Hyperlipidemia, unspecified: Secondary | ICD-10-CM

## 2012-12-01 DIAGNOSIS — E1129 Type 2 diabetes mellitus with other diabetic kidney complication: Secondary | ICD-10-CM

## 2012-12-01 LAB — POCT GLYCOSYLATED HEMOGLOBIN (HGB A1C): Hemoglobin A1C: 5.3

## 2012-12-01 NOTE — Progress Notes (Signed)
  Subjective:    Patient ID: Joshua Scott, male    DOB: 1934-12-19, 78 y.o.   MRN: [id number]  HPI DM - No cuts or sores that aren't healing well. No hypoglycemic events.  Sugars running 120s in AM.  His wife is here with him today. He does take a baby aspirin daily as well as a statin and Diovan. He has been getting some coryza and glipizide her mail order. No side effects or problems.  Chronic kidney disease stage III-does have followup with nephrology in about 2 weeks. He says they typically do bloodwork there.  Hyperlidemia  - doing well on statin on 20mg .  Cuts in half. No myalgias or side effects on the medication.  Review of Systems     Objective:   Physical Exam  Constitutional: He is oriented to person, place, and time. He appears well-developed and well-nourished.  HENT:  Head: Normocephalic and atraumatic.  Cardiovascular: Normal rate, regular rhythm and normal heart sounds.   Pulmonary/Chest: Effort normal and breath sounds normal.  Neurological: He is alert and oriented to person, place, and time.  Skin: Skin is warm and dry.  Psychiatric: He has a normal mood and affect. His behavior is normal.          Assessment & Plan:  DM- Well controlled at A1C is 5.3.  F/U in 4 months.  Minotor for lows. On statin, ASA, ARB.  Monitor for hypoglycemia. It becomes a problem then consider decreasing glipizide.  Hyperlipidemia  - go back ot whole tab 20mg  tab.  Explained to him that based on studies moderate to higher dose statins as what seems to reduce risk and improve outcomes. So I did recommend he go back to a whole tab if he tolerates it. If Having problems then he can go ahead and cut in half again  CKD3 - has appointment with nephrology in about 2 weeks. He says they typically do bloodwork. Thus we'll hold off on checking BUN and creatinine today.  Has already had flu shots.

## 2012-12-16 DIAGNOSIS — E559 Vitamin D deficiency, unspecified: Secondary | ICD-10-CM | POA: Diagnosis not present

## 2012-12-16 DIAGNOSIS — N189 Chronic kidney disease, unspecified: Secondary | ICD-10-CM | POA: Diagnosis not present

## 2012-12-16 LAB — HEPATIC FUNCTION PANEL
ALT: 11 U/L (ref 10–40)
AST: 12 U/L — AB (ref 14–40)
Alkaline Phosphatase: 63 U/L (ref 25–125)

## 2012-12-16 LAB — BASIC METABOLIC PANEL: Creatinine: 1 mg/dL (ref 0.6–1.3)

## 2012-12-16 LAB — PHOSPHORUS
Phosphorus: 3.2 mg/dL (ref 2.5–4.9)
Vit D, 25-Hydroxy: 23

## 2012-12-25 ENCOUNTER — Encounter: Payer: Self-pay | Admitting: *Deleted

## 2013-01-10 ENCOUNTER — Other Ambulatory Visit: Payer: Self-pay | Admitting: Family Medicine

## 2013-01-16 ENCOUNTER — Encounter: Payer: Self-pay | Admitting: *Deleted

## 2013-01-31 ENCOUNTER — Other Ambulatory Visit: Payer: Self-pay | Admitting: Family Medicine

## 2013-02-03 ENCOUNTER — Other Ambulatory Visit: Payer: Self-pay | Admitting: Family Medicine

## 2013-02-11 ENCOUNTER — Other Ambulatory Visit: Payer: Self-pay | Admitting: Family Medicine

## 2013-02-12 ENCOUNTER — Other Ambulatory Visit: Payer: Self-pay | Admitting: Family Medicine

## 2013-03-31 ENCOUNTER — Telehealth: Payer: Self-pay | Admitting: Family Medicine

## 2013-03-31 ENCOUNTER — Encounter: Payer: Self-pay | Admitting: Family Medicine

## 2013-03-31 ENCOUNTER — Ambulatory Visit (INDEPENDENT_AMBULATORY_CARE_PROVIDER_SITE_OTHER): Payer: Medicare Other | Admitting: Family Medicine

## 2013-03-31 VITALS — BP 114/67 | HR 97 | Temp 98.3°F | Wt 239.0 lb

## 2013-03-31 DIAGNOSIS — I252 Old myocardial infarction: Secondary | ICD-10-CM

## 2013-03-31 DIAGNOSIS — E1129 Type 2 diabetes mellitus with other diabetic kidney complication: Secondary | ICD-10-CM

## 2013-03-31 DIAGNOSIS — N183 Chronic kidney disease, stage 3 unspecified: Secondary | ICD-10-CM | POA: Diagnosis not present

## 2013-03-31 DIAGNOSIS — R0789 Other chest pain: Secondary | ICD-10-CM

## 2013-03-31 DIAGNOSIS — M109 Gout, unspecified: Secondary | ICD-10-CM | POA: Diagnosis not present

## 2013-03-31 MED ORDER — SIMVASTATIN 40 MG PO TABS: ORAL_TABLET | ORAL | Status: DC

## 2013-03-31 NOTE — Patient Instructions (Signed)
Remember to get your eye exam.

## 2013-03-31 NOTE — Progress Notes (Signed)
Subjective:    Patient ID: Joshua Scott, male    DOB: 07-Feb-1934, 79 y.o.   MRN: [id number]  HPI Diabetes - no hypoglycemic events. No wounds or sores that are not healing well. No increased thirst or urination. Checking glucose at home. Taking medications as prescribed without any side effects.  Chronic kidney disease stage III- due to recheck kidney function.    Some Chest pain - About a month ago had some chest pain and then next day had some pain in the left arm.  No SOB. No pain into the jaw. No diaphoresis. Occurred at rest.  Lasted about 2-3 days.    Gout - no recent flares with his gout.  Taking allopurinol regularly.  Review of Systems  BP 114/67  Pulse 97  Temp(Src) 98.3 F (36.8 C)  Wt 239 lb (108.41 kg)  SpO2 95%    Allergies  Allergen Reactions  . Metformin And Related Other (See Comments)    CKD 3 cuased worsening renal level.     Past Medical History  Diagnosis Date  . Hyperlipidemia   . Diabetes mellitus   . Hypertension   . Gout     Past Surgical History  Procedure Laterality Date  . Cataract extraction w/ intraocular lens implant  06/14/11    Left eye  . Cataract extraction w/ intraocular lens implant  07/03/11    Right eye    History   Social History  . Marital Status: Married    Spouse Name: N/A    Number of Children: N/A  . Years of Education: N/A   Occupational History  . Not on file.   Social History Main Topics  . Smoking status: Former Smoker    Quit date: 01/23/1984  . Smokeless tobacco: Not on file  . Alcohol Use: No  . Drug Use: No  . Sexual Activity:    Other Topics Concern  . Not on file   Social History Narrative  . No narrative on file    Family History  Problem Relation Age of Onset  . Diabetes Mother     Outpatient Encounter Prescriptions as of 03/31/2013  Medication Sig  . allopurinol (ZYLOPRIM) 300 MG tablet TAKE 1 TABLET DAILY  . aspirin EC 81 MG tablet Take 81 mg by mouth daily.  Marland Kitchen DIOVAN 320 MG tablet  TAKE 1 TABLET DAILY  . doxazosin (CARDURA) 2 MG tablet Take 1 tablet (2 mg total) by mouth at bedtime.  . Needles & Syringes MISC    . NIASPAN 500 MG CR tablet TAKE 1 TABLET AT BEDTIME  . saxagliptin HCl (ONGLYZA) 5 MG TABS tablet TAKE 1 TABLET DAILY  . simvastatin (ZOCOR) 40 MG tablet TAKE 1 TABLET BY MOUTH ONCE A DAY  . tadalafil (CIALIS) 5 MG tablet Take 5 mg by mouth daily as needed.  . [DISCONTINUED] GLIPIZIDE XL 5 MG 24 hr tablet TAKE 1 TABLET DAILY  . [DISCONTINUED] simvastatin (ZOCOR) 20 MG tablet TAKE 1 TABLET BY MOUTH ONCE A DAY           Objective:   Physical Exam  Constitutional: He is oriented to person, place, and time. He appears well-developed and well-nourished.  HENT:  Head: Normocephalic and atraumatic.  Cardiovascular: Normal rate, regular rhythm and normal heart sounds.   Pulmonary/Chest: Effort normal and breath sounds normal.  Neurological: He is alert and oriented to person, place, and time.  Skin: Skin is warm and dry.  Psychiatric: He has a normal mood and affect. His  behavior is normal.          Assessment & Plan:  Diabetes- Well controlled.  On ASA, and statin, and ARB. Recommend statin dose to moderate dose level,m simvastatin 40mg  QHS. Due for eye exam. Due for lipids.   Chronic kidney disease stage III - On ARB.  Recheck kidney function today.  Gout - Well controlled.  Will check uric acid level. Last one was approximately 2 years ago. Continue current regimen.  Atypical chest pain - EKG shows rate of 97 beats per minute, normal sinus rhythm with Q waves in the inferior leads suggestive of an old inferior infarct. No significant ST-T wave changes. Will schedule for treadmill stress test.  He does have diabetes and a prior history of smoking. No family history. This certainly enough risk factors that he could have some coronary artery disease.

## 2013-03-31 NOTE — Telephone Encounter (Signed)
I have scheduled an Exercise Tolerance Test(per Referral) at [facility] for April 7th @11 :00 a.m. Pt aware.

## 2013-04-01 NOTE — Telephone Encounter (Signed)
Pt's wife called back to confirm time of appt.Audelia Hives [place]

## 2013-04-02 LAB — LIPID PANEL
CHOLESTEROL: 125 mg/dL (ref 0–200)
HDL: 32 mg/dL — AB (ref >39)
LDL Cholesterol: 78 mg/dL (ref 0–99)
TRIGLYCERIDES: 73 mg/dL (ref <150)
Total CHOL/HDL Ratio: 3.9 Ratio
VLDL: 15 mg/dL (ref 0–40)

## 2013-04-02 LAB — COMPLETE METABOLIC PANEL WITH GFR
ALT: 15 U/L (ref 0–53)
AST: 11 U/L (ref 0–37)
Albumin: 4.2 g/dL (ref 3.5–5.2)
Alkaline Phosphatase: 57 U/L (ref 39–117)
BUN: 21 mg/dL (ref 6–23)
CO2: 28 mEq/L (ref 19–32)
Calcium: 9 mg/dL (ref 8.4–10.5)
Chloride: 106 mEq/L (ref 96–112)
Creat: 1.08 mg/dL (ref 0.50–1.35)
GFR, Est African American: 77 mL/min
GFR, Est Non African American: 66 mL/min
Glucose, Bld: 130 mg/dL — ABNORMAL HIGH (ref 70–99)
Potassium: 4.5 mEq/L (ref 3.5–5.3)
Sodium: 140 mEq/L (ref 135–145)
Total Bilirubin: 0.8 mg/dL (ref 0.2–1.2)
Total Protein: 6.2 g/dL (ref 6.0–8.3)

## 2013-04-02 LAB — URIC ACID: Uric Acid, Serum: 5.1 mg/dL (ref 4.0–7.8)

## 2013-04-02 LAB — HEMOGLOBIN A1C
Hgb A1c MFr Bld: 5.7 % — ABNORMAL HIGH (ref <5.7)
Mean Plasma Glucose: 117 mg/dL — ABNORMAL HIGH (ref <117)

## 2013-04-07 ENCOUNTER — Encounter: Payer: Self-pay | Admitting: *Deleted

## 2013-04-13 ENCOUNTER — Ambulatory Visit: Payer: Medicare Other | Admitting: Family Medicine

## 2013-04-24 ENCOUNTER — Other Ambulatory Visit: Payer: Self-pay | Admitting: Family Medicine

## 2013-04-28 ENCOUNTER — Encounter: Payer: Self-pay | Admitting: Nurse Practitioner

## 2013-04-28 ENCOUNTER — Ambulatory Visit (INDEPENDENT_AMBULATORY_CARE_PROVIDER_SITE_OTHER): Payer: Medicare Other | Admitting: Nurse Practitioner

## 2013-04-28 VITALS — BP 133/94 | HR 102

## 2013-04-28 DIAGNOSIS — R0789 Other chest pain: Secondary | ICD-10-CM | POA: Diagnosis not present

## 2013-04-28 DIAGNOSIS — I252 Old myocardial infarction: Secondary | ICD-10-CM

## 2013-04-28 NOTE — Telephone Encounter (Signed)
Please call patient: I got a note saying he was unable to do the treadmill stress test to further evaluate his heart. I would like him to see cardiology and said. It is okay with this referral then please let me know.

## 2013-04-28 NOTE — Telephone Encounter (Signed)
Oh, ok sounds good. I didn't see they were scheduling him for that.  Let do the nuclear stress test.

## 2013-04-28 NOTE — Progress Notes (Signed)
Exercise Treadmill Test  Pre-Exercise Testing Evaluation Rhythm: sinus tachycardia  Rate: 92 bpm     Test  Exercise Tolerance Test Ordering MD: Daneen Schick, MD  Interpreting MD: Truitt Merle, NP  Unique Test No: 1  Treadmill:  1  Indication for ETT: chest pain - rule out ischemia  Contraindication to ETT: No   Stress Modality: exercise - treadmill  Cardiac Imaging Performed: non   Protocol: standard Bruce - maximal  Max BP:  NA  Max MPHR (bpm):  144 85% MPR (bpm):  122  MPHR obtained (bpm):  NA % MPHR obtained:  NA  Reached 85% MPHR (min:sec):  NA Total Exercise Time (min-sec):  NA  Workload in METS:  NA Borg Scale: NA  Reason ETT Terminated:      ST Segment Analysis At Rest:  With Exercise:   Other Information Arrhythmia:    Angina during ETT:   Quality of ETT:    ETT Interpretation:    Comments: Patient presents today for routine GXT. Referred by his PCP. Was referred due to abnormal EKG. Has HTN, DM, HLD, and past smoker.  He does not exercise regularly. Has never walked on a treadmill.   We attempted to exercise him on the standard Bruce protocol - however he was not able to walk on the treadmill. Almost fell. The test was aborted prior to even starting.    Recommendations: Lexiscan Myoview for further disposition.   Patient is agreeable to this plan and will call if any problems develop in the interim.   Burtis Junes, RN, [facility] [address] [place], [place]  [address] [phone]

## 2013-04-28 NOTE — Telephone Encounter (Signed)
Spoke with pt's wife and she said that they scheduled him to do a nuclear stress test. However, she is fine with whatever Dr. Madilyn Fireman suggests.Audelia Hives [place]

## 2013-04-29 NOTE — Telephone Encounter (Signed)
Pt's wife informed.Teddy Spike

## 2013-05-05 ENCOUNTER — Other Ambulatory Visit: Payer: Self-pay | Admitting: Family Medicine

## 2013-05-18 ENCOUNTER — Ambulatory Visit (HOSPITAL_COMMUNITY): Payer: Medicare Other | Attending: Internal Medicine | Admitting: Radiology

## 2013-05-18 VITALS — BP 149/101 | HR 77 | Ht 76.0 in | Wt 238.0 lb

## 2013-05-18 DIAGNOSIS — I10 Essential (primary) hypertension: Secondary | ICD-10-CM | POA: Diagnosis not present

## 2013-05-18 DIAGNOSIS — E119 Type 2 diabetes mellitus without complications: Secondary | ICD-10-CM | POA: Insufficient documentation

## 2013-05-18 DIAGNOSIS — R0789 Other chest pain: Secondary | ICD-10-CM | POA: Insufficient documentation

## 2013-05-18 DIAGNOSIS — R002 Palpitations: Secondary | ICD-10-CM | POA: Insufficient documentation

## 2013-05-18 DIAGNOSIS — Z87891 Personal history of nicotine dependence: Secondary | ICD-10-CM | POA: Insufficient documentation

## 2013-05-18 DIAGNOSIS — R079 Chest pain, unspecified: Secondary | ICD-10-CM | POA: Diagnosis not present

## 2013-05-18 DIAGNOSIS — R9431 Abnormal electrocardiogram [ECG] [EKG]: Secondary | ICD-10-CM | POA: Diagnosis not present

## 2013-05-18 MED ORDER — TECHNETIUM TC 99M SESTAMIBI GENERIC - CARDIOLITE
33.0000 | Freq: Once | INTRAVENOUS | Status: AC | PRN
  Administered 2013-05-18: 33 via INTRAVENOUS

## 2013-05-18 MED ORDER — TECHNETIUM TC 99M SESTAMIBI GENERIC - CARDIOLITE
11.0000 | Freq: Once | INTRAVENOUS | Status: AC | PRN
Start: 2013-05-18 — End: 2013-05-18
  Administered 2013-05-18: 11 via INTRAVENOUS

## 2013-05-18 MED ORDER — REGADENOSON 0.4 MG/5ML IV SOLN
0.4000 mg | Freq: Once | INTRAVENOUS | Status: AC
  Administered 2013-05-18: 0.4 mg via INTRAVENOUS

## 2013-05-18 NOTE — Progress Notes (Signed)
[facility] [address] [place], [place] [address] [phone]    Cardiology Nuclear Med Study  Joshua Scott is a 79 y.o. male     MRN : [id number]     DOB: 01/10/1935  Procedure Date: 05/18/2013  Nuclear Med Background Indication for Stress Test:  Evaluation for Ischemia and Abnormal EKG History:  No known CAD Cardiac Risk Factors: History of Smoking, Hypertension, Lipids and NIDDM  Symptoms:  Chest Pain (last date of chest discomfort was two months ago) and Palpitations   Nuclear Pre-Procedure Caffeine/Decaff Intake:  None NPO After: 7:00pm   Lungs:  clear O2 Sat: 94% on room air. IV 0.9% NS with Angio Cath:  22g  IV Site: R Hand  IV Started by:  Matilde Haymaker, RN  Chest Size (in):  50 Cup Size: n/a  Height: 6\' 4"  (1.93 m)  Weight:  238 lb (107.956 kg)  BMI:  Body mass index is 28.98 kg/(m^2). Tech Comments:  n/a    Nuclear Med Study 1 or 2 day study: 1 day  Stress Test Type:  Lexiscan  Reading MD: n/a  Order Authorizing Provider:  Wyvonnia Lora  Resting Radionuclide: Technetium 58m Sestamibi  Resting Radionuclide Dose: 11.0 mCi   Stress Radionuclide:  Technetium 59m Sestamibi  Stress Radionuclide Dose: 33.0 mCi           Stress Protocol Rest HR: 77 Stress HR: 107  Rest BP: 149/101 Stress BP: 142/93  Exercise Time (min): n/a METS: n/a           Dose of Adenosine (mg):  n/a Dose of Lexiscan: 0.4 mg  Dose of Atropine (mg): n/a Dose of Dobutamine: n/a mcg/kg/min (at max HR)  Stress Test Technologist: Glade Lloyd, BS-ES  Nuclear Technologist:  Charlton Amor, CNMT     Rest Procedure:  Myocardial perfusion imaging was performed at rest 45 minutes following the intravenous administration of Technetium 93m Sestamibi. Rest ECG: NSR with non-specific ST-T wave changes  Stress Procedure:  The patient received IV Lexiscan 0.4 mg over 15-seconds.  Technetium 40m Sestamibi injected at 30-seconds.  Quantitative spect images were  obtained after a 45 minute delay.  During the infusion of Lexiscan, the patient complained of being slightly SOB.  This resolved in recovery.  Stress ECG: No significant change from baseline ECG  QPS Raw Data Images:  Normal; no motion artifact; normal heart/lung ratio. There is mild diaphragmatic attenuation. Stress Images:  There is a moderate-sized, moderate in intensity defect along the inferolateral wall distribution from base to mid region, mostly fixed. This region likely represents old infarct with mild peri-infarct ischemia Rest Images:  Moderate sized, moderate in intensity perfusion defect along the base to mid inferolateral distribution. Subtraction (SDS):  7 Transient Ischemic Dilatation (Normal <1.22):  1.01 Lung/Heart Ratio (Normal <0.45):  0.30  Quantitative Gated Spect Images QGS EDV:  114 ml QGS ESV:  49 ml  Impression Exercise Capacity:  Lexiscan with no exercise. BP Response:  Normal blood pressure response. Clinical Symptoms:  Mild shortness of breath ECG Impression:  No significant ST segment change suggestive of ischemia. Comparison with Prior Nuclear Study: No previous nuclear study performed  Overall Impression:  Intermediate risk stress nuclear study with inferior lateral defect concerning for old infarct pattern with mild peri-infarct ischemia..  LV Ejection Fraction: 57%.  LV Wall Motion:  NL LV Function; NL Wall Motion There does not seem to be wall motion abnormality in the inferior/lateral distribution.   Candee Furbish, MD

## 2013-05-21 ENCOUNTER — Telehealth: Payer: Self-pay | Admitting: *Deleted

## 2013-05-21 ENCOUNTER — Telehealth: Payer: Self-pay

## 2013-05-21 NOTE — Telephone Encounter (Signed)
Message copied by Lamar Laundry on Thu May 21, 2013 11:48 AM ------      Message from: Daneen Schick      Created: Wed May 20, 2013  7:47 AM       Is this my patient? The result was forwarded to me. See my commentary. ------

## 2013-05-21 NOTE — Telephone Encounter (Signed)
Pt's wife was calling about his stress test results. I asked had she heard back from their office she has not. Please advise.Teddy Spike

## 2013-05-22 ENCOUNTER — Telehealth: Payer: Self-pay | Admitting: *Deleted

## 2013-05-22 ENCOUNTER — Encounter (HOSPITAL_COMMUNITY): Payer: Self-pay | Admitting: Radiology

## 2013-05-22 DIAGNOSIS — R9439 Abnormal result of other cardiovascular function study: Secondary | ICD-10-CM

## 2013-05-22 DIAGNOSIS — R079 Chest pain, unspecified: Secondary | ICD-10-CM

## 2013-05-22 NOTE — Telephone Encounter (Signed)
They called from [facility] stating that the pt's wife called asking for the results of the NM test. States it apparently didn't get results to you and they wanted to let you know you can see the results and states that it was sent to your basket.

## 2013-05-22 NOTE — Progress Notes (Signed)
Patient ID: Joshua Scott, male   DOB: [birth year], 79 y.o.   MRN: [id number] Called and spoke with Triage nurse at Dr. Gardiner Ramus office, to let them know that I just routed a Nuclear Stress test to Dr. Bethena Midget for her to review.  Evalina Field, RT-N

## 2013-05-22 NOTE — Telephone Encounter (Signed)
Call patient: Exams are confusion. It does look like there is evidence of an old heart attack. Being that he has had some recent chest pain I would like him to see cardiology and have a consult with him about whether not he may need further evaluation with catheterization etc. or just medical management. I will place a referral.

## 2013-07-02 ENCOUNTER — Ambulatory Visit (INDEPENDENT_AMBULATORY_CARE_PROVIDER_SITE_OTHER): Payer: Medicare Other | Admitting: Family Medicine

## 2013-07-02 ENCOUNTER — Encounter: Payer: Self-pay | Admitting: Family Medicine

## 2013-07-02 VITALS — BP 120/80 | HR 93 | Wt 239.0 lb

## 2013-07-02 DIAGNOSIS — E785 Hyperlipidemia, unspecified: Secondary | ICD-10-CM | POA: Diagnosis not present

## 2013-07-02 DIAGNOSIS — E1129 Type 2 diabetes mellitus with other diabetic kidney complication: Secondary | ICD-10-CM | POA: Diagnosis not present

## 2013-07-02 LAB — POCT GLYCOSYLATED HEMOGLOBIN (HGB A1C): HEMOGLOBIN A1C: 5.4

## 2013-07-02 LAB — POCT UA - MICROALBUMIN
Albumin/Creatinine Ratio, Urine, POC: <30
CREATININE, POC: 300 mg/dL
MICROALBUMIN (UR) POC: 30 mg/L

## 2013-07-02 NOTE — Progress Notes (Signed)
   Subjective:    Patient ID: Joshua Scott, male    DOB: April 21, 1934, 79 y.o.   MRN: [id number]  HPI  Diabetes - no hypoglycemic events. No wounds or sores that are not healing well. No increased thirst or urination. Checking glucose at home. Taking medications as prescribed without any side effects. His wife says she will call to schedule his eye exam.  Hyperlipidemia-last labs showed that his total cholesterol and LDL look fantastic. Triglycerides are still somewhat elevated. He is taking his statin without any side effects or myalgias. Review of Systems     Objective:   Physical Exam  Constitutional: He is oriented to person, place, and time. He appears well-developed and well-nourished.  HENT:  Head: Normocephalic and atraumatic.  Cardiovascular: Normal rate, regular rhythm and normal heart sounds.   Pulmonary/Chest: Effort normal and breath sounds normal.  Neurological: He is alert and oriented to person, place, and time.  Skin: Skin is warm and dry.  Psychiatric: He has a normal mood and affect. His behavior is normal.          Assessment & Plan:  Diabetes- A1C is 5. 4. Will schedule eye exam this summer. On aspirin, ARB, and statin. Due for microalbumin as well. Exam performed today as well.  Hyperlipidemia-well-controlled except for triglycerides. Continue work on low fat diet and regular exercise.

## 2013-07-08 ENCOUNTER — Other Ambulatory Visit: Payer: Self-pay | Admitting: Cardiology

## 2013-07-08 ENCOUNTER — Encounter: Payer: Self-pay | Admitting: *Deleted

## 2013-07-08 ENCOUNTER — Ambulatory Visit (INDEPENDENT_AMBULATORY_CARE_PROVIDER_SITE_OTHER): Payer: Medicare Other | Admitting: Cardiology

## 2013-07-08 ENCOUNTER — Other Ambulatory Visit: Payer: Self-pay | Admitting: Family Medicine

## 2013-07-08 ENCOUNTER — Encounter: Payer: Self-pay | Admitting: Cardiology

## 2013-07-08 VITALS — BP 120/92 | HR 121 | Ht 77.0 in | Wt 240.1 lb

## 2013-07-08 DIAGNOSIS — T148XXA Other injury of unspecified body region, initial encounter: Secondary | ICD-10-CM | POA: Diagnosis not present

## 2013-07-08 DIAGNOSIS — R079 Chest pain, unspecified: Secondary | ICD-10-CM

## 2013-07-08 DIAGNOSIS — I10 Essential (primary) hypertension: Secondary | ICD-10-CM

## 2013-07-08 DIAGNOSIS — E785 Hyperlipidemia, unspecified: Secondary | ICD-10-CM

## 2013-07-08 DIAGNOSIS — R9430 Abnormal result of cardiovascular function study, unspecified: Secondary | ICD-10-CM | POA: Diagnosis not present

## 2013-07-08 DIAGNOSIS — R0602 Shortness of breath: Secondary | ICD-10-CM

## 2013-07-08 NOTE — Assessment & Plan Note (Signed)
Continue statin. 

## 2013-07-08 NOTE — Progress Notes (Signed)
HPI: 79 year old male for evaluation of chest pain. Patient had an episode of chest pain approximately 2 months ago. He does not recall his activities at that time. It was described as a tightness. No radiation. No associated symptoms. Lasted 5 minutes and resolved. Not pleuritic or positional. Seen by primary care and electrocardiogram felt possibly consistent with prior inferior infarct. Nuclear study in April of 2015 showed an ejection fraction of 57%. There was an inferior lateral defect concerning for prior infarction with mild peri-infarct ischemia. Patient has some dyspnea on exertion but no orthopnea, PND, pedal edema, exertional chest pain or syncope.  Current Outpatient Prescriptions  Medication Sig Dispense Refill  . allopurinol (ZYLOPRIM) 300 MG tablet TAKE 1 TABLET DAILY  90 tablet  2  . aspirin EC 81 MG tablet Take 81 mg by mouth daily.      Marland Kitchen DIOVAN 320 MG tablet TAKE 1 TABLET DAILY  90 tablet  0  . doxazosin (CARDURA) 2 MG tablet TAKE 1 TABLET AT BEDTIME  90 tablet  0  . GLIPIZIDE XL 5 MG 24 hr tablet TAKE 1 TABLET DAILY  90 tablet  0  . NIASPAN 500 MG CR tablet TAKE 1 TABLET AT BEDTIME  90 tablet  0  . ONGLYZA 5 MG TABS tablet TAKE 1 TABLET DAILY  90 tablet  0  . simvastatin (ZOCOR) 40 MG tablet TAKE 1 TABLET BY MOUTH ONCE A DAY  90 tablet  2  . tadalafil (CIALIS) 5 MG tablet Take 5 mg by mouth daily as needed.       No current facility-administered medications for this visit.    Allergies  Allergen Reactions  . Metformin And Related Other (See Comments)    CKD 3 cuased worsening renal level.     Past Medical History  Diagnosis Date  . Hyperlipidemia   . Diabetes mellitus   . Hypertension   . Gout   . ED (erectile dysfunction)     Past Surgical History  Procedure Laterality Date  . Cataract extraction w/ intraocular lens implant  06/14/11    Left eye  . Cataract extraction w/ intraocular lens implant  07/03/11    Right eye    History   Social History    . Marital Status: Married    Spouse Name: N/A    Number of Children: 3  . Years of Education: N/A   Occupational History  .      Retired from [employer]  . Smoking status: Former Smoker    Quit date: 01/23/1984  . Smokeless tobacco: Not on file  . Alcohol Use: No  . Drug Use: No  . Sexual Activity:    Other Topics Concern  . Not on file   Social History Narrative  . No narrative on file    Family History  Problem Relation Age of Onset  . Diabetes Mother     ROS: Knee arthralgias and some pain with raising left arm but no fevers or chills, productive cough, hemoptysis, dysphasia, odynophagia, melena, hematochezia, dysuria, hematuria, rash, seizure activity, orthopnea, PND, pedal edema, claudication. Remaining systems are negative.  Physical Exam:   Blood pressure 120/92, pulse 121, height 6\' 5"  (1.956 m), weight 240 lb 1.9 oz (108.918 kg).  General:  Well developed/well nourished in NAD Skin warm/dry Patient not depressed No peripheral clubbing Back-normal HEENT-normal/normal eyelids Neck supple/normal carotid upstroke bilaterally; no bruits; no JVD; no thyromegaly chest - CTA/ normal expansion CV -  RRR/normal S1 and S2; no murmurs, rubs or gallops;  PMI nondisplaced Abdomen -NT/ND, no HSM, no mass, + bowel sounds, no bruit 2+ femoral pulses, no bruits Ext-no edema, chords, 2+ DP Neuro-grossly nonfocal  ECG March 31 2013-sinus tachycardia, prior inferior infarct cannot be excluded.

## 2013-07-08 NOTE — Patient Instructions (Signed)
Your physician recommends that you schedule a follow-up appointment in: [place] has requested that you have a cardiac catheterization. Cardiac catheterization is used to diagnose and/or treat various heart conditions. Doctors may recommend this procedure for a number of different reasons. The most common reason is to evaluate chest pain. Chest pain can be a symptom of coronary artery disease (CAD), and cardiac catheterization can show whether plaque is narrowing or blocking your heart's arteries. This procedure is also used to evaluate the valves, as well as measure the blood flow and oxygen levels in different parts of your heart. For further information please visit [email or web address]. Please follow instruction sheet, as given.   Your physician recommends that you HAVE LAB WORK TODAY

## 2013-07-08 NOTE — Assessment & Plan Note (Signed)
Blood pressure controlled. Continue present medications. 

## 2013-07-08 NOTE — Assessment & Plan Note (Signed)
Patient had brief chest pain. However he has multiple risk factors including 30 years of diabetes mellitus. His nuclear study shows preserved LV function but a prior inferior lateral infarct and mild peri-infarct ischemia. I discussed the options today including medical therapy versus proceeding with cardiac catheterization. Given his abnormal nuclear study and 30 years of diabetes mellitus I have recommended cardiac catheterization. The risks and benefits were discussed and he agrees to proceed. Continue aspirin and statin.

## 2013-07-09 ENCOUNTER — Encounter (HOSPITAL_COMMUNITY): Payer: Self-pay | Admitting: Pharmacy Technician

## 2013-07-09 LAB — BASIC METABOLIC PANEL WITH GFR
BUN: 13 mg/dL (ref 6–23)
CALCIUM: 9.3 mg/dL (ref 8.4–10.5)
CO2: 23 mEq/L (ref 19–32)
Chloride: 104 mEq/L (ref 96–112)
Creat: 1.11 mg/dL (ref 0.50–1.35)
GFR, EST AFRICAN AMERICAN: 74 mL/min
GFR, EST NON AFRICAN AMERICAN: 64 mL/min
Glucose, Bld: 131 mg/dL — ABNORMAL HIGH (ref 70–99)
POTASSIUM: 4.6 meq/L (ref 3.5–5.3)
Sodium: 139 mEq/L (ref 135–145)

## 2013-07-09 LAB — PROTIME-INR
INR: 1.02 (ref <1.50)
Prothrombin Time: 13.3 seconds (ref 11.6–15.2)

## 2013-07-14 ENCOUNTER — Ambulatory Visit (HOSPITAL_COMMUNITY)
Admission: RE | Admit: 2013-07-14 | Discharge: 2013-07-14 | Disposition: A | Discharge status: Home / Self Care | Payer: Medicare Other | Source: Ambulatory Visit | Attending: Cardiology | Admitting: Cardiology

## 2013-07-14 ENCOUNTER — Encounter (HOSPITAL_COMMUNITY)
Admission: RE | Disposition: A | Discharge status: Home / Self Care | Payer: Self-pay | Source: Ambulatory Visit | Attending: Cardiology

## 2013-07-14 DIAGNOSIS — E785 Hyperlipidemia, unspecified: Secondary | ICD-10-CM | POA: Insufficient documentation

## 2013-07-14 DIAGNOSIS — M109 Gout, unspecified: Secondary | ICD-10-CM | POA: Insufficient documentation

## 2013-07-14 DIAGNOSIS — Z87891 Personal history of nicotine dependence: Secondary | ICD-10-CM | POA: Insufficient documentation

## 2013-07-14 DIAGNOSIS — I10 Essential (primary) hypertension: Secondary | ICD-10-CM | POA: Insufficient documentation

## 2013-07-14 DIAGNOSIS — N529 Male erectile dysfunction, unspecified: Secondary | ICD-10-CM | POA: Insufficient documentation

## 2013-07-14 DIAGNOSIS — E119 Type 2 diabetes mellitus without complications: Secondary | ICD-10-CM | POA: Insufficient documentation

## 2013-07-14 DIAGNOSIS — R0602 Shortness of breath: Secondary | ICD-10-CM

## 2013-07-14 DIAGNOSIS — I2510 Atherosclerotic heart disease of native coronary artery without angina pectoris: Secondary | ICD-10-CM | POA: Insufficient documentation

## 2013-07-14 HISTORY — PX: LEFT HEART CATHETERIZATION WITH CORONARY ANGIOGRAM: SHX5451

## 2013-07-14 LAB — GLUCOSE, CAPILLARY: GLUCOSE-CAPILLARY: 109 mg/dL — AB (ref 70–99)

## 2013-07-14 LAB — CBC
HCT: 40.8 % (ref 39.0–52.0)
HEMOGLOBIN: 14.1 g/dL (ref 13.0–17.0)
MCH: 29.3 pg (ref 26.0–34.0)
MCHC: 34.6 g/dL (ref 30.0–36.0)
MCV: 84.8 fL (ref 78.0–100.0)
Platelets: 133 10*3/uL — ABNORMAL LOW (ref 150–400)
RBC: 4.81 MIL/uL (ref 4.22–5.81)
RDW: 13.6 % (ref 11.5–15.5)
WBC: 4.6 10*3/uL (ref 4.0–10.5)

## 2013-07-14 SURGERY — LEFT HEART CATHETERIZATION WITH CORONARY ANGIOGRAM
Anesthesia: LOCAL

## 2013-07-14 MED ORDER — ASPIRIN 81 MG PO CHEW: 81.0000 mg | CHEWABLE_TABLET | ORAL | Status: DC

## 2013-07-14 MED ORDER — HEPARIN (PORCINE) IN NACL 2-0.9 UNIT/ML-% IJ SOLN
INTRAMUSCULAR | Status: AC
  Filled 2013-07-14: qty 1000

## 2013-07-14 MED ORDER — SODIUM CHLORIDE 0.9 % IJ SOLN: 3.0000 mL | INTRAMUSCULAR | Status: DC | PRN

## 2013-07-14 MED ORDER — LIDOCAINE HCL (PF) 1 % IJ SOLN
INTRAMUSCULAR | Status: AC
  Filled 2013-07-14: qty 30

## 2013-07-14 MED ORDER — VERAPAMIL HCL 2.5 MG/ML IV SOLN
INTRAVENOUS | Status: AC
  Filled 2013-07-14: qty 2

## 2013-07-14 MED ORDER — MIDAZOLAM HCL 2 MG/2ML IJ SOLN
INTRAMUSCULAR | Status: AC
  Filled 2013-07-14: qty 2

## 2013-07-14 MED ORDER — SODIUM CHLORIDE 0.9 % IJ SOLN: 3.0000 mL | Freq: Two times a day (BID) | INTRAMUSCULAR | Status: DC

## 2013-07-14 MED ORDER — FENTANYL CITRATE 0.05 MG/ML IJ SOLN
INTRAMUSCULAR | Status: AC
  Filled 2013-07-14: qty 2

## 2013-07-14 MED ORDER — SODIUM CHLORIDE 0.9 % IV SOLN
1.0000 mL/kg/h | INTRAVENOUS | Status: DC
  Administered 2013-07-14: 1 mL/kg/h via INTRAVENOUS

## 2013-07-14 MED ORDER — SODIUM CHLORIDE 0.9 % IV SOLN: 250.0000 mL | INTRAVENOUS | Status: DC | PRN

## 2013-07-14 MED ORDER — SODIUM CHLORIDE 0.9 % IV SOLN: 1.0000 mL/kg/h | INTRAVENOUS | Status: DC

## 2013-07-14 MED ORDER — HEPARIN SODIUM (PORCINE) 1000 UNIT/ML IJ SOLN
INTRAMUSCULAR | Status: AC
  Filled 2013-07-14: qty 1

## 2013-07-14 NOTE — Interval H&P Note (Signed)
History and Physical Interval Note:  07/14/2013 11:42 AM  Joshua Scott  has presented today for surgery, with the diagnosis of ABNORMAL NUCLEAR STRESS TEST  The various methods of treatment have been discussed with the patient and family. After consideration of risks, benefits and other options for treatment, the patient has consented to  Procedure(s): LEFT HEART CATHETERIZATION WITH CORONARY ANGIOGRAM (N/A) as a surgical intervention .  The patient's history has been reviewed, patient examined, no change in status, stable for surgery.  I have reviewed the patient's chart and labs.  Questions were answered to the patient's satisfaction.   Cath Lab Visit (complete for each Cath Lab visit)  Clinical Evaluation Leading to the Procedure:   ACS: no  Non-ACS:    Anginal Classification: CCS I  Anti-ischemic medical therapy: No Therapy  Non-Invasive Test Results: Intermediate-risk stress test findings: cardiac mortality 1-3%/year  Prior CABG: No previous CABG        [name] [facility] 07/14/2013 11:42 AM

## 2013-07-14 NOTE — H&P (View-Only) (Signed)
HPI: 79 year old male for evaluation of chest pain. Patient had an episode of chest pain approximately 2 months ago. He does not recall his activities at that time. It was described as a tightness. No radiation. No associated symptoms. Lasted 5 minutes and resolved. Not pleuritic or positional. Seen by primary care and electrocardiogram felt possibly consistent with prior inferior infarct. Nuclear study in April of 2015 showed an ejection fraction of 57%. There was an inferior lateral defect concerning for prior infarction with mild peri-infarct ischemia. Patient has some dyspnea on exertion but no orthopnea, PND, pedal edema, exertional chest pain or syncope.  Current Outpatient Prescriptions  Medication Sig Dispense Refill  . allopurinol (ZYLOPRIM) 300 MG tablet TAKE 1 TABLET DAILY  90 tablet  2  . aspirin EC 81 MG tablet Take 81 mg by mouth daily.      Marland Kitchen DIOVAN 320 MG tablet TAKE 1 TABLET DAILY  90 tablet  0  . doxazosin (CARDURA) 2 MG tablet TAKE 1 TABLET AT BEDTIME  90 tablet  0  . GLIPIZIDE XL 5 MG 24 hr tablet TAKE 1 TABLET DAILY  90 tablet  0  . NIASPAN 500 MG CR tablet TAKE 1 TABLET AT BEDTIME  90 tablet  0  . ONGLYZA 5 MG TABS tablet TAKE 1 TABLET DAILY  90 tablet  0  . simvastatin (ZOCOR) 40 MG tablet TAKE 1 TABLET BY MOUTH ONCE A DAY  90 tablet  2  . tadalafil (CIALIS) 5 MG tablet Take 5 mg by mouth daily as needed.       No current facility-administered medications for this visit.    Allergies  Allergen Reactions  . Metformin And Related Other (See Comments)    CKD 3 cuased worsening renal level.     Past Medical History  Diagnosis Date  . Hyperlipidemia   . Diabetes mellitus   . Hypertension   . Gout   . ED (erectile dysfunction)     Past Surgical History  Procedure Laterality Date  . Cataract extraction w/ intraocular lens implant  06/14/11    Left eye  . Cataract extraction w/ intraocular lens implant  07/03/11    Right eye    History   Social History    . Marital Status: Married    Spouse Name: N/A    Number of Children: 3  . Years of Education: N/A   Occupational History  .      Retired from [employer]  . Smoking status: Former Smoker    Quit date: 01/23/1984  . Smokeless tobacco: Not on file  . Alcohol Use: No  . Drug Use: No  . Sexual Activity:    Other Topics Concern  . Not on file   Social History Narrative  . No narrative on file    Family History  Problem Relation Age of Onset  . Diabetes Mother     ROS: Knee arthralgias and some pain with raising left arm but no fevers or chills, productive cough, hemoptysis, dysphasia, odynophagia, melena, hematochezia, dysuria, hematuria, rash, seizure activity, orthopnea, PND, pedal edema, claudication. Remaining systems are negative.  Physical Exam:   Blood pressure 120/92, pulse 121, height 6\' 5"  (1.956 m), weight 240 lb 1.9 oz (108.918 kg).  General:  Well developed/well nourished in NAD Skin warm/dry Patient not depressed No peripheral clubbing Back-normal HEENT-normal/normal eyelids Neck supple/normal carotid upstroke bilaterally; no bruits; no JVD; no thyromegaly chest - CTA/ normal expansion CV -  RRR/normal S1 and S2; no murmurs, rubs or gallops;  PMI nondisplaced Abdomen -NT/ND, no HSM, no mass, + bowel sounds, no bruit 2+ femoral pulses, no bruits Ext-no edema, chords, 2+ DP Neuro-grossly nonfocal  ECG March 31 2013-sinus tachycardia, prior inferior infarct cannot be excluded.

## 2013-07-14 NOTE — CV Procedure (Signed)
    Cardiac Catheterization Procedure Note  Name: Joshua Scott MRN: [id number] DOB: [birth year]  Procedure: Selective Coronary Angiography  Indication: 79 yo WM with history of DM, HTN, HL presents with an abnormal stress nuclear study. He denies any chest pain. EF was 57%.   Procedural Details: The right wrist was prepped, draped, and anesthetized with 1% lidocaine. Using the modified Seldinger technique, a 6 French slender sheath was introduced into the right radial artery. 3 mg of verapamil was administered through the sheath, weight-based unfractionated heparin was administered intravenously. Standard Judkins catheters were used for selective coronary angiography. Catheter exchanges were performed over an exchange length guidewire. Catheter maninpulation was difficult due to marked tortuosity of the innominate artery. There were no immediate procedural complications. A TR band was used for radial hemostasis at the completion of the procedure.  The patient was transferred to the post catheterization recovery area for further monitoring.  Procedural Findings: Hemodynamics: AO 134/83 mean 102 mm Hg LV NA  Coronary angiography: Coronary dominance: right  Left mainstem: mild irregularities less than 10%.  Left anterior descending (LAD): The LAD is a large vessel extending to the apex. It is moderately calcified. In the mid vessel there is mild disease up to 30%. The first diagonal is without significant disease.   Left circumflex (LCx): The LCX has 60-70% stenosis in the proximal vessel. The mid and distal LCx are diffusely diseased to 50%. The first OM is a modest sized vessel with 95-99% stenosis. The second and third OM are without significant disease.   Right coronary artery (RCA): The RCA is moderately calcified and ectatic. There is a 70-80% stenosis at the crux. The PLOM branch is occluded and fills by left to right collaterals.   Left ventriculography: not done  Final Conclusions:    1. Severe 2 vessel obstructive CAD with subtotal occlusion of the first OM and occlusion of the PLOM branch with collaterals.  Recommendations: Medical management. The crux of the RCA and the first OM are potential targets for PCI but the patient is currently asymptomatic on no antianginal therapy. If he has refractory symptoms despite optimal medical therapy could consider PCI but I think he will do better with medical therapy.  Peter Martinique, [place]  07/14/2013, 12:18 PM

## 2013-07-14 NOTE — Discharge Instructions (Signed)
Radial Site Care °Refer to this sheet in the next few weeks. These instructions provide you with information on caring for yourself after your procedure. Your caregiver may also give you more specific instructions. Your treatment has been planned according to current medical practices, but problems sometimes occur. Call your caregiver if you have any problems or questions after your procedure. °HOME CARE INSTRUCTIONS °· You may shower the day after the procedure. Remove the bandage (dressing) and gently wash the site with plain soap and water. Gently pat the site dry. °· Do not apply powder or lotion to the site. °· Do not submerge the affected site in water for 3 to 5 days. °· Inspect the site at least twice daily. °· Do not flex or bend the affected arm for 24 hours. °· No lifting over 5 pounds (2.3 kg) for 5 days after your procedure. °· Do not drive home if you are discharged the same day of the procedure. Have someone else drive you. °· You may drive 24 hours after the procedure unless otherwise instructed by your caregiver. °· Do not operate machinery or power tools for 24 hours. °· A responsible adult should be with you for the first 24 hours after you arrive home. °What to expect: °· Any bruising will usually fade within 1 to 2 weeks. °· Blood that collects in the tissue (hematoma) may be painful to the touch. It should usually decrease in size and tenderness within 1 to 2 weeks. °SEEK IMMEDIATE MEDICAL CARE IF: °· You have unusual pain at the radial site. °· You have redness, warmth, swelling, or pain at the radial site. °· You have drainage (other than a small amount of blood on the dressing). °· You have chills. °· You have a fever or persistent symptoms for more than 72 hours. °· You have a fever and your symptoms suddenly get worse. °· Your arm becomes pale, cool, tingly, or numb. °· You have heavy bleeding from the site. Hold pressure on the site. °Document Released: 02/10/2010 Document Revised:  04/02/2011 Document Reviewed: 02/10/2010 °ExitCare® Patient Information ©2015 [employer]. This information is not intended to replace advice given to you by your health care provider. Make sure you discuss any questions you have with your health care provider. ° °

## 2013-07-23 ENCOUNTER — Other Ambulatory Visit: Payer: Self-pay | Admitting: Family Medicine

## 2013-08-09 ENCOUNTER — Other Ambulatory Visit: Payer: Self-pay | Admitting: Family Medicine

## 2013-08-24 ENCOUNTER — Other Ambulatory Visit: Payer: Self-pay | Admitting: Family Medicine

## 2013-09-02 ENCOUNTER — Ambulatory Visit (INDEPENDENT_AMBULATORY_CARE_PROVIDER_SITE_OTHER): Payer: Medicare Other | Admitting: Cardiology

## 2013-09-02 ENCOUNTER — Encounter: Payer: Self-pay | Admitting: Cardiology

## 2013-09-02 VITALS — BP 138/88 | HR 80 | Ht 70.0 in | Wt 237.8 lb

## 2013-09-02 DIAGNOSIS — I2510 Atherosclerotic heart disease of native coronary artery without angina pectoris: Secondary | ICD-10-CM | POA: Insufficient documentation

## 2013-09-02 DIAGNOSIS — E785 Hyperlipidemia, unspecified: Secondary | ICD-10-CM | POA: Diagnosis not present

## 2013-09-02 MED ORDER — ATORVASTATIN CALCIUM 80 MG PO TABS: 80.0000 mg | ORAL_TABLET | Freq: Every day | ORAL | Status: DC

## 2013-09-02 MED ORDER — METOPROLOL SUCCINATE ER 25 MG PO TB24: 25.0000 mg | ORAL_TABLET | Freq: Every day | ORAL | Status: DC

## 2013-09-02 NOTE — Assessment & Plan Note (Signed)
Plan medical therapy as outlined by Dr. Martinique. Continue aspirin and change statin to high-dose. Add Toprol 25 mg daily.

## 2013-09-02 NOTE — Assessment & Plan Note (Signed)
Blood pressure mildly elevated and patient with coronary disease. Add Toprol 25 mg daily.

## 2013-09-02 NOTE — Progress Notes (Signed)
      HPI: FU CAD. Nuclear study in April of 2015 showed an ejection fraction of 57%. There was an inferior lateral defect concerning for prior infarction with mild peri-infarct ischemia. Cardiac catheterization in June 2015 showed mild irregularities in the left main, 30% LAD, 60-70% proximal circumflex, mid 50% and a 95-99% first obtuse marginal. There was a 70-80% right coronary artery. Medical therapy recommended. Since last seen, the patient denies any dyspnea on exertion, orthopnea, PND, pedal edema, palpitations, syncope or chest pain.    Current Outpatient Prescriptions  Medication Sig Dispense Refill  . allopurinol (ZYLOPRIM) 300 MG tablet TAKE 1 TABLET DAILY  90 tablet  1  . aspirin EC 81 MG tablet Take 81 mg by mouth daily.      Marland Kitchen doxazosin (CARDURA) 2 MG tablet TAKE 1 TABLET AT BEDTIME  90 tablet  1  . GLIPIZIDE XL 5 MG 24 hr tablet TAKE 1 TABLET DAILY  90 tablet  1  . NIASPAN 500 MG CR tablet TAKE 1 TABLET AT BEDTIME  90 tablet  1  . saxagliptin HCl (ONGLYZA) 5 MG TABS tablet Take 5 mg by mouth daily.      . simvastatin (ZOCOR) 40 MG tablet Take 40 mg by mouth daily.      . valsartan (DIOVAN) 320 MG tablet Take 320 mg by mouth daily.       No current facility-administered medications for this visit.     Past Medical History  Diagnosis Date  . Hyperlipidemia   . Diabetes mellitus   . Hypertension   . Gout   . ED (erectile dysfunction)   . CAD (coronary artery disease)     Past Surgical History  Procedure Laterality Date  . Cataract extraction w/ intraocular lens implant  06/14/11    Left eye  . Cataract extraction w/ intraocular lens implant  07/03/11    Right eye    History   Social History  . Marital Status: Married    Spouse Name: N/A    Number of Children: 3  . Years of Education: N/A   Occupational History  .      Retired from [employer]  . Smoking status: Former Smoker    Quit date: 01/23/1984  . Smokeless tobacco: Not  on file  . Alcohol Use: No  . Drug Use: No  . Sexual Activity:    Other Topics Concern  . Not on file   Social History Narrative  . No narrative on file    ROS: no fevers or chills, productive cough, hemoptysis, dysphasia, odynophagia, melena, hematochezia, dysuria, hematuria, rash, seizure activity, orthopnea, PND, pedal edema, claudication. Remaining systems are negative.  Physical Exam: Well-developed well-nourished in no acute distress.  Skin is warm and dry.  HEENT is normal.  Neck is supple.  Chest is clear to auscultation with normal expansion.  Cardiovascular exam is regular rate and rhythm.  Abdominal exam nontender or distended. No masses palpated. Extremities show no edema. Right radial cath site with no hematoma. neuro grossly intact

## 2013-09-02 NOTE — Assessment & Plan Note (Signed)
Given documented coronary disease will discontinue Zocor; begin Lipitor 80 mg daily. Discontinue Niaspan. Check lipids and liver in 6 weeks.

## 2013-09-02 NOTE — Patient Instructions (Addendum)
Your physician wants you to follow-up in: [place] will receive a reminder letter in the mail two months in advance. If you don't receive a letter, please call our office to schedule the follow-up appointment.   STOP SIMVASTATIN WHEN FINISHED WITH CURRENT SUPPLY  START ATORVASTATIN 80 MG ONCE DAILY AFTER STOPPING SIMVASTATIN  START METOPROLOL 25 MG ONCE DAILY AT BEDTIME  Your physician recommends that you return for lab work in: [place]

## 2013-09-18 ENCOUNTER — Other Ambulatory Visit: Payer: Self-pay | Admitting: Family Medicine

## 2013-10-02 ENCOUNTER — Encounter: Payer: Self-pay | Admitting: Family Medicine

## 2013-10-02 ENCOUNTER — Ambulatory Visit (INDEPENDENT_AMBULATORY_CARE_PROVIDER_SITE_OTHER): Payer: Medicare Other | Admitting: Family Medicine

## 2013-10-02 VITALS — BP 125/86 | HR 92 | Wt 240.0 lb

## 2013-10-02 DIAGNOSIS — N183 Chronic kidney disease, stage 3 unspecified: Secondary | ICD-10-CM | POA: Diagnosis not present

## 2013-10-02 DIAGNOSIS — I129 Hypertensive chronic kidney disease with stage 1 through stage 4 chronic kidney disease, or unspecified chronic kidney disease: Secondary | ICD-10-CM | POA: Diagnosis not present

## 2013-10-02 DIAGNOSIS — Z87898 Personal history of other specified conditions: Secondary | ICD-10-CM

## 2013-10-02 DIAGNOSIS — I2510 Atherosclerotic heart disease of native coronary artery without angina pectoris: Secondary | ICD-10-CM

## 2013-10-02 DIAGNOSIS — I10 Essential (primary) hypertension: Secondary | ICD-10-CM

## 2013-10-02 DIAGNOSIS — E1129 Type 2 diabetes mellitus with other diabetic kidney complication: Secondary | ICD-10-CM

## 2013-10-02 DIAGNOSIS — Z23 Encounter for immunization: Secondary | ICD-10-CM

## 2013-10-02 LAB — POCT GLYCOSYLATED HEMOGLOBIN (HGB A1C): Hemoglobin A1C: 5.7

## 2013-10-02 MED ORDER — DOXAZOSIN MESYLATE 4 MG PO TABS: ORAL_TABLET | ORAL | Status: DC

## 2013-10-02 NOTE — Assessment & Plan Note (Signed)
Will increase the cardura ot 4mg .  Let me know if not helping.

## 2013-10-02 NOTE — Assessment & Plan Note (Addendum)
Well-controlled on current regimen with the recent addition of Toprol.

## 2013-10-02 NOTE — Assessment & Plan Note (Signed)
Due to recheck kidney function today.

## 2013-10-02 NOTE — Assessment & Plan Note (Signed)
Lab Results  Component Value Date   HGBA1C 5.7 10/02/2013    Well controlled.  Overdue for eye exam. Reminded to schedule an appointment. Given flu shot today. Continue current regimen. Followup in 3-4 months.

## 2013-10-02 NOTE — Assessment & Plan Note (Signed)
Doing well with switch of statin. Repeat lipids in a couple weeks. Will check liver enzymes at that time as well.

## 2013-10-02 NOTE — Progress Notes (Signed)
   Subjective:    Patient ID: Joshua Scott, male    DOB: Jun 17, 1934, 79 y.o.   MRN: [id number]  Diabetes   Diabetes - no hypoglycemic events. No wounds or sores that are not healing well. No increased thirst or urination. Checking glucose at home. Taking medications as prescribed without any side effects.   Coronary artery disease-Toprol 25 mg was recently added to his regimen by his cardiologist. His simvastatin was recently discontinued and he was switched to atorvastatin 80 mg. He's been tolerating it well so far without any side effects or myalgias. He is due to repeat his lipids in about 2-3 weeks.  Hypertension- Pt denies chest pain, SOB, dizziness, or heart palpitations.  Taking meds as directed w/o problems.  Denies medication side effects.    Review of Systems     Objective:   Physical Exam  Constitutional: He is oriented to person, place, and time. He appears well-developed and well-nourished.  HENT:  Head: Normocephalic and atraumatic.  Neck: Neck supple. No thyromegaly present.  Cardiovascular: Normal rate, regular rhythm and normal heart sounds.   Pulmonary/Chest: Effort normal and breath sounds normal.  Musculoskeletal: He exhibits no edema.  Lymphadenopathy:    He has no cervical adenopathy.  Neurological: He is alert and oriented to person, place, and time.  Skin: Skin is warm and dry.  Psychiatric: He has a normal mood and affect. His behavior is normal.          Assessment & Plan:

## 2013-10-21 DIAGNOSIS — E1129 Type 2 diabetes mellitus with other diabetic kidney complication: Secondary | ICD-10-CM | POA: Diagnosis not present

## 2013-10-21 DIAGNOSIS — E785 Hyperlipidemia, unspecified: Secondary | ICD-10-CM | POA: Diagnosis not present

## 2013-10-21 DIAGNOSIS — I10 Essential (primary) hypertension: Secondary | ICD-10-CM | POA: Diagnosis not present

## 2013-10-22 ENCOUNTER — Encounter: Payer: Self-pay | Admitting: *Deleted

## 2013-10-22 LAB — LIPID PANEL
CHOL/HDL RATIO: 3.2 ratio
Cholesterol: 97 mg/dL (ref 0–200)
HDL: 30 mg/dL — AB (ref >39)
LDL Cholesterol: 50 mg/dL (ref 0–99)
Triglycerides: 84 mg/dL (ref <150)
VLDL: 17 mg/dL (ref 0–40)

## 2013-10-22 LAB — COMPLETE METABOLIC PANEL WITH GFR
ALT: 16 U/L (ref 0–53)
AST: 11 U/L (ref 0–37)
Albumin: 4.1 g/dL (ref 3.5–5.2)
Alkaline Phosphatase: 78 U/L (ref 39–117)
BILIRUBIN TOTAL: 0.8 mg/dL (ref 0.2–1.2)
BUN: 15 mg/dL (ref 6–23)
CO2: 28 mEq/L (ref 19–32)
CREATININE: 1.01 mg/dL (ref 0.50–1.35)
Calcium: 9 mg/dL (ref 8.4–10.5)
Chloride: 107 mEq/L (ref 96–112)
GFR, Est African American: 83 mL/min
GFR, Est Non African American: 71 mL/min
Glucose, Bld: 124 mg/dL — ABNORMAL HIGH (ref 70–99)
Potassium: 4.3 mEq/L (ref 3.5–5.3)
Sodium: 141 mEq/L (ref 135–145)
Total Protein: 6 g/dL (ref 6.0–8.3)

## 2013-10-22 LAB — HEPATIC FUNCTION PANEL
ALBUMIN: 4 g/dL (ref 3.5–5.2)
ALT: 15 U/L (ref 0–53)
AST: 12 U/L (ref 0–37)
Alkaline Phosphatase: 79 U/L (ref 39–117)
BILIRUBIN TOTAL: 0.8 mg/dL (ref 0.2–1.2)
Bilirubin, Direct: 0.2 mg/dL (ref 0.0–0.3)
Indirect Bilirubin: 0.6 mg/dL (ref 0.2–1.2)
Total Protein: 6 g/dL (ref 6.0–8.3)

## 2013-10-22 NOTE — Progress Notes (Signed)
Quick Note:  All labs are normal. ______ 

## 2013-11-02 ENCOUNTER — Ambulatory Visit: Payer: Medicare Other | Admitting: Sports Medicine

## 2013-11-03 ENCOUNTER — Other Ambulatory Visit: Payer: Self-pay | Admitting: Family Medicine

## 2013-11-29 ENCOUNTER — Other Ambulatory Visit: Payer: Self-pay | Admitting: Family Medicine

## 2013-12-07 DIAGNOSIS — E785 Hyperlipidemia, unspecified: Secondary | ICD-10-CM | POA: Diagnosis not present

## 2013-12-07 DIAGNOSIS — N183 Chronic kidney disease, stage 3 (moderate): Secondary | ICD-10-CM | POA: Diagnosis not present

## 2013-12-07 DIAGNOSIS — I10 Essential (primary) hypertension: Secondary | ICD-10-CM | POA: Diagnosis not present

## 2013-12-07 DIAGNOSIS — E669 Obesity, unspecified: Secondary | ICD-10-CM | POA: Diagnosis not present

## 2013-12-07 DIAGNOSIS — E119 Type 2 diabetes mellitus without complications: Secondary | ICD-10-CM | POA: Diagnosis not present

## 2013-12-14 ENCOUNTER — Other Ambulatory Visit: Payer: Self-pay | Admitting: Family Medicine

## 2013-12-20 ENCOUNTER — Other Ambulatory Visit: Payer: Self-pay | Admitting: Family Medicine

## 2013-12-31 ENCOUNTER — Encounter (HOSPITAL_COMMUNITY): Payer: Self-pay | Admitting: Cardiology

## 2014-01-01 ENCOUNTER — Encounter: Payer: Self-pay | Admitting: Family Medicine

## 2014-01-01 ENCOUNTER — Ambulatory Visit (INDEPENDENT_AMBULATORY_CARE_PROVIDER_SITE_OTHER): Payer: Medicare Other | Admitting: Family Medicine

## 2014-01-01 VITALS — BP 135/79 | HR 93 | Temp 98.1°F | Wt 242.0 lb

## 2014-01-01 DIAGNOSIS — E1121 Type 2 diabetes mellitus with diabetic nephropathy: Secondary | ICD-10-CM | POA: Diagnosis not present

## 2014-01-01 DIAGNOSIS — I10 Essential (primary) hypertension: Secondary | ICD-10-CM | POA: Diagnosis not present

## 2014-01-01 DIAGNOSIS — I2510 Atherosclerotic heart disease of native coronary artery without angina pectoris: Secondary | ICD-10-CM | POA: Diagnosis not present

## 2014-01-01 DIAGNOSIS — E1129 Type 2 diabetes mellitus with other diabetic kidney complication: Secondary | ICD-10-CM

## 2014-01-01 LAB — POCT GLYCOSYLATED HEMOGLOBIN (HGB A1C): Hemoglobin A1C: 5.9

## 2014-01-01 NOTE — Patient Instructions (Signed)

## 2014-01-01 NOTE — Progress Notes (Signed)
   Subjective:    Patient ID: Olaf Mesa, male    DOB: 1934-12-25, 79 y.o.   MRN: [id number]  HPI Hypertension- Pt denies chest pain, SOB, dizziness, or heart palpitations.  Taking meds as directed w/o problems.  Denies medication side effects.    Diabetes - no hypoglycemic events. No wounds or sores that are not healing well. No increased thirst or urination. Checking glucose at home. Taking medications as prescribed without any side effects.   CAD- no CP or SOB.   Review of Systems     Objective:   Physical Exam  Constitutional: He is oriented to person, place, and time. He appears well-developed and well-nourished.  HENT:  Head: Normocephalic and atraumatic.  Cardiovascular: Normal rate, regular rhythm and normal heart sounds.   Pulmonary/Chest: Effort normal and breath sounds normal.  Neurological: He is alert and oriented to person, place, and time.  Skin: Skin is warm and dry.  Psychiatric: He has a normal mood and affect. His behavior is normal.          Assessment & Plan:  DM- well-controlled. Hemoglobin A1c is 5.9 today which is fantastic. He is on ARB and a statin and aspirin. Reminded patient due for eye exam.  HTN-- well controlled on current regimen.

## 2014-01-14 ENCOUNTER — Other Ambulatory Visit: Payer: Self-pay | Admitting: Family Medicine

## 2014-02-04 DIAGNOSIS — R809 Proteinuria, unspecified: Secondary | ICD-10-CM | POA: Diagnosis not present

## 2014-02-04 DIAGNOSIS — N189 Chronic kidney disease, unspecified: Secondary | ICD-10-CM | POA: Diagnosis not present

## 2014-02-04 DIAGNOSIS — N250 Renal osteodystrophy: Secondary | ICD-10-CM | POA: Diagnosis not present

## 2014-02-04 DIAGNOSIS — D649 Anemia, unspecified: Secondary | ICD-10-CM | POA: Diagnosis not present

## 2014-02-04 LAB — PHOSPHORUS: Phosphorus: 3

## 2014-02-04 LAB — MICROALBUMIN / CREATININE URINE RATIO
Creatinine, Ur: 147.7 mg/dL
Protein Creatinine Ratio: 0.05
Total Protein: 7 g/dL

## 2014-02-04 LAB — PTH, INTACT AND CALCIUM
Calcium: 8.9 mg/dL
PTH: 122 pg/mL

## 2014-02-04 LAB — COMPREHENSIVE METABOLIC PANEL (CC13)
ALBUMIN: 3.9
CALCIUM: 8.9 mg/dL
Chloride, Serum: 104

## 2014-02-15 LAB — BASIC METABOLIC PANEL
Creatinine: 0.9 mg/dL (ref 0.6–1.3)
Glucose: 99 mg/dL
Potassium: 3.8 mmol/L (ref 3.4–5.3)
SODIUM: 139 mmol/L (ref 137–147)

## 2014-02-15 LAB — CBC AND DIFFERENTIAL
Hemoglobin: 15.1 g/dL (ref 13.5–17.5)
PLATELETS: 150 10*3/uL (ref 150–399)
WBC: 5 10*3/mL

## 2014-02-15 LAB — HEPATIC FUNCTION PANEL
ALT: 15 U/L (ref 10–40)
AST: 12 U/L — AB (ref 14–40)

## 2014-02-28 ENCOUNTER — Encounter: Payer: Self-pay | Admitting: Emergency Medicine

## 2014-03-02 NOTE — Progress Notes (Signed)
      HPI: FU CAD. Nuclear study in April of 2015 showed an ejection fraction of 57%. There was an inferior lateral defect concerning for prior infarction with mild peri-infarct ischemia. Cardiac catheterization in June 2015 showed mild irregularities in the left main, 30% LAD, 60-70% proximal circumflex, mid 50% and a 95-99% first obtuse marginal. There was a 70-80% right coronary artery. Medical therapy recommended. Since last seen, the patient has dyspnea with more extreme activities but not with routine activities. It is relieved with rest. It is not associated with chest pain. There is no orthopnea, PND or pedal edema. There is no syncope or palpitations. There is no exertional chest pain.   Current Outpatient Prescriptions  Medication Sig Dispense Refill  . allopurinol (ZYLOPRIM) 300 MG tablet TAKE 1 TABLET DAILY 90 tablet 1  . aspirin EC 81 MG tablet Take 81 mg by mouth daily.    Marland Kitchen atorvastatin (LIPITOR) 80 MG tablet Take 1 tablet (80 mg total) by mouth daily. 90 tablet 3  . DIOVAN 320 MG tablet TAKE 1 TABLET DAILY 90 tablet 0  . doxazosin (CARDURA) 4 MG tablet TAKE 1 TABLET AT BEDTIME 90 tablet 3  . GLIPIZIDE XL 5 MG 24 hr tablet TAKE 1 TABLET DAILY 90 tablet 0  . metoprolol succinate (TOPROL XL) 25 MG 24 hr tablet Take 1 tablet (25 mg total) by mouth daily. 30 tablet 12  . saxagliptin HCl (ONGLYZA) 5 MG TABS tablet Take 5 mg by mouth daily.     No current facility-administered medications for this visit.     Past Medical History  Diagnosis Date  . Hyperlipidemia   . Diabetes mellitus   . Hypertension   . Gout   . ED (erectile dysfunction)   . CAD (coronary artery disease)     Past Surgical History  Procedure Laterality Date  . Cataract extraction w/ intraocular lens implant  06/14/11    Left eye  . Cataract extraction w/ intraocular lens implant  07/03/11    Right eye  . Left heart catheterization with coronary angiogram N/A 07/14/2013    Procedure: LEFT HEART  CATHETERIZATION WITH CORONARY ANGIOGRAM;  Surgeon: Peter M Martinique, MD;  Location[facility] CATH LAB;  Service: Cardiovascular;  Laterality: N/A;    History   Social History  . Marital Status: Married    Spouse Name: N/A  . Number of Children: 3  . Years of Education: N/A   Occupational History  .      Retired from [employer]  . Smoking status: Former Smoker    Quit date: 01/23/1984  . Smokeless tobacco: Not on file  . Alcohol Use: No  . Drug Use: No  . Sexual Activity: Not on file   Other Topics Concern  . Not on file   Social History Narrative    ROS: no fevers or chills, productive cough, hemoptysis, dysphasia, odynophagia, melena, hematochezia, dysuria, hematuria, rash, seizure activity, orthopnea, PND, pedal edema, claudication. Remaining systems are negative.  Physical Exam: Well-developed well-nourished in no acute distress.  Skin is warm and dry.  HEENT is normal.  Neck is supple.  Chest is clear to auscultation with normal expansion.  Cardiovascular exam is regular rate and rhythm.  Abdominal exam nontender or distended. No masses palpated. Extremities show no edema. neuro grossly intact  ECG sinus tachycardia, low voltage, cannot rule out prior inferior infarct.

## 2014-03-03 ENCOUNTER — Ambulatory Visit (INDEPENDENT_AMBULATORY_CARE_PROVIDER_SITE_OTHER): Payer: Medicare Other | Admitting: Cardiology

## 2014-03-03 ENCOUNTER — Other Ambulatory Visit: Payer: Self-pay | Admitting: Family Medicine

## 2014-03-03 ENCOUNTER — Encounter: Payer: Self-pay | Admitting: Cardiology

## 2014-03-03 VITALS — BP 122/80 | HR 105 | Ht 77.0 in | Wt 232.0 lb

## 2014-03-03 DIAGNOSIS — I2510 Atherosclerotic heart disease of native coronary artery without angina pectoris: Secondary | ICD-10-CM

## 2014-03-03 DIAGNOSIS — I10 Essential (primary) hypertension: Secondary | ICD-10-CM

## 2014-03-03 NOTE — Assessment & Plan Note (Signed)
Blood pressure controlled. Continue present medications. 

## 2014-03-03 NOTE — Assessment & Plan Note (Signed)
Continue statin. 

## 2014-03-03 NOTE — Assessment & Plan Note (Signed)
Continue aspirin and statin. 

## 2014-03-03 NOTE — Patient Instructions (Signed)
Your physician wants you to follow-up in: ONE YEAR WITH DR CRENSHAW You will receive a reminder letter in the mail two months in advance. If you don't receive a letter, please call our office to schedule the follow-up appointment.  

## 2014-03-29 ENCOUNTER — Other Ambulatory Visit: Payer: Self-pay | Admitting: Family Medicine

## 2014-04-01 ENCOUNTER — Encounter: Payer: Self-pay | Admitting: Family Medicine

## 2014-04-02 ENCOUNTER — Ambulatory Visit (INDEPENDENT_AMBULATORY_CARE_PROVIDER_SITE_OTHER): Payer: Medicare Other | Admitting: Family Medicine

## 2014-04-02 ENCOUNTER — Encounter: Payer: Self-pay | Admitting: Family Medicine

## 2014-04-02 VITALS — BP 134/88 | HR 90 | Wt 236.0 lb

## 2014-04-02 DIAGNOSIS — Z23 Encounter for immunization: Secondary | ICD-10-CM

## 2014-04-02 DIAGNOSIS — M1000 Idiopathic gout, unspecified site: Secondary | ICD-10-CM | POA: Diagnosis not present

## 2014-04-02 DIAGNOSIS — E1122 Type 2 diabetes mellitus with diabetic chronic kidney disease: Secondary | ICD-10-CM | POA: Diagnosis not present

## 2014-04-02 DIAGNOSIS — I2510 Atherosclerotic heart disease of native coronary artery without angina pectoris: Secondary | ICD-10-CM

## 2014-04-02 DIAGNOSIS — I10 Essential (primary) hypertension: Secondary | ICD-10-CM | POA: Diagnosis not present

## 2014-04-02 DIAGNOSIS — N183 Chronic kidney disease, stage 3 (moderate): Secondary | ICD-10-CM | POA: Diagnosis not present

## 2014-04-02 DIAGNOSIS — E1129 Type 2 diabetes mellitus with other diabetic kidney complication: Secondary | ICD-10-CM

## 2014-04-02 LAB — POCT GLYCOSYLATED HEMOGLOBIN (HGB A1C): HEMOGLOBIN A1C: 5.8

## 2014-04-02 NOTE — Progress Notes (Signed)
Subjective:    Patient ID: Joshua Scott, male    DOB: 09/06/1934, 80 y.o.   MRN: [id number]  HPI Diabetes - no hypoglycemic events. No wounds or sores that are not healing well. No increased thirst or urination. Checking glucose at home. Taking medications as prescribed without any side effects.  Hypertension- Pt denies chest pain, SOB, dizziness, or heart palpitations.  Taking meds as directed w/o problems.  Denies medication side effects.    Gout - Hadn't had a flare in years.  Says has been on the 300mg  aily for years.    Review of Systems  BP 134/88 mmHg  Pulse 90  Wt 236 lb (107.049 kg)  SpO2 95%    Allergies  Allergen Reactions  . Metformin And Related Other (See Comments)    CKD 3 cuased worsening renal level.     Past Medical History  Diagnosis Date  . Hyperlipidemia   . Diabetes mellitus   . Hypertension   . Gout   . ED (erectile dysfunction)   . CAD (coronary artery disease)     Past Surgical History  Procedure Laterality Date  . Cataract extraction w/ intraocular lens implant  06/14/11    Left eye  . Cataract extraction w/ intraocular lens implant  07/03/11    Right eye  . Left heart catheterization with coronary angiogram N/A 07/14/2013    Procedure: LEFT HEART CATHETERIZATION WITH CORONARY ANGIOGRAM;  Surgeon: Peter M Martinique, MD;  Location: [facility] CATH LAB;  Service: Cardiovascular;  Laterality: N/A;    History   Social History  . Marital Status: Married    Spouse Name: N/A  . Number of Children: 3  . Years of Education: N/A   Occupational History  .      Retired from [employer]  . Smoking status: Former Smoker    Quit date: 01/23/1984  . Smokeless tobacco: Not on file  . Alcohol Use: No  . Drug Use: No  . Sexual Activity: Not on file   Other Topics Concern  . Not on file   Social History Narrative    Family History  Problem Relation Age of Onset  . Diabetes Mother     Outpatient Encounter Prescriptions as of  04/02/2014  Medication Sig  . allopurinol (ZYLOPRIM) 300 MG tablet TAKE 1 TABLET DAILY  . aspirin EC 81 MG tablet Take 81 mg by mouth daily.  Marland Kitchen atorvastatin (LIPITOR) 80 MG tablet Take 1 tablet (80 mg total) by mouth daily.  Marland Kitchen doxazosin (CARDURA) 4 MG tablet TAKE 1 TABLET AT BEDTIME  . GLIPIZIDE XL 5 MG 24 hr tablet TAKE 1 TABLET DAILY  . metoprolol succinate (TOPROL XL) 25 MG 24 hr tablet Take 1 tablet (25 mg total) by mouth daily.  . saxagliptin HCl (ONGLYZA) 5 MG TABS tablet Take 5 mg by mouth daily.  . valsartan (DIOVAN) 320 MG tablet TAKE 1 TABLET DAILY          Objective:   Physical Exam  Constitutional: He is oriented to person, place, and time. He appears well-developed and well-nourished.  HENT:  Head: Normocephalic and atraumatic.  Cardiovascular: Normal rate, regular rhythm and normal heart sounds.   Pulmonary/Chest: Effort normal and breath sounds normal.  Neurological: He is alert and oriented to person, place, and time.  Skin: Skin is warm and dry.  Psychiatric: He has a normal mood and affect. His behavior is normal.          Assessment &  Plan:  HTN - well controlled.  Continue current regimen. Follow-up in 4 months.   DM- well controlled.  Doing well. Continue current regimen per fall up in 4 months. Reminded him to get his diabetic eye exam. His last one was a most 3 years ago. Reminded him that Medicare does pay for the exam.  Gout - well controlled. Continue current regimen.  Prevnar 13 given today.

## 2014-04-02 NOTE — Addendum Note (Signed)
Addended by: Teddy Spike on: 04/02/2014 03:26 PM   Modules accepted: Orders

## 2014-05-05 ENCOUNTER — Other Ambulatory Visit: Payer: Self-pay | Admitting: Family Medicine

## 2014-05-13 DIAGNOSIS — E119 Type 2 diabetes mellitus without complications: Secondary | ICD-10-CM | POA: Diagnosis not present

## 2014-05-13 LAB — HM DIABETES EYE EXAM

## 2014-05-14 ENCOUNTER — Other Ambulatory Visit: Payer: Self-pay | Admitting: *Deleted

## 2014-05-14 ENCOUNTER — Other Ambulatory Visit: Payer: Self-pay | Admitting: Family Medicine

## 2014-05-14 MED ORDER — GLIPIZIDE ER 5 MG PO TB24: 5.0000 mg | ORAL_TABLET | Freq: Every day | ORAL | Status: DC

## 2014-07-21 ENCOUNTER — Other Ambulatory Visit: Payer: Self-pay | Admitting: Cardiology

## 2014-07-21 NOTE — Telephone Encounter (Signed)
REFILL 

## 2014-08-02 ENCOUNTER — Encounter: Payer: Self-pay | Admitting: Family Medicine

## 2014-08-02 ENCOUNTER — Ambulatory Visit (INDEPENDENT_AMBULATORY_CARE_PROVIDER_SITE_OTHER): Payer: Medicare Other | Admitting: Family Medicine

## 2014-08-02 VITALS — BP 128/85 | HR 61 | Ht 77.0 in | Wt 241.0 lb

## 2014-08-02 DIAGNOSIS — N183 Chronic kidney disease, stage 3 unspecified: Secondary | ICD-10-CM

## 2014-08-02 DIAGNOSIS — I10 Essential (primary) hypertension: Secondary | ICD-10-CM | POA: Diagnosis not present

## 2014-08-02 DIAGNOSIS — E1121 Type 2 diabetes mellitus with diabetic nephropathy: Secondary | ICD-10-CM | POA: Diagnosis not present

## 2014-08-02 DIAGNOSIS — M150 Primary generalized (osteo)arthritis: Secondary | ICD-10-CM

## 2014-08-02 DIAGNOSIS — M159 Polyosteoarthritis, unspecified: Secondary | ICD-10-CM | POA: Insufficient documentation

## 2014-08-02 DIAGNOSIS — E1129 Type 2 diabetes mellitus with other diabetic kidney complication: Secondary | ICD-10-CM

## 2014-08-02 LAB — POCT GLYCOSYLATED HEMOGLOBIN (HGB A1C): Hemoglobin A1C: 6

## 2014-08-02 NOTE — Progress Notes (Signed)
   Subjective:    Patient ID: Joshua Scott, male    DOB: July 11, 1934, 80 y.o.   MRN: [id number]  HPI Diabetes - no hypoglycemic events. No wounds or sores that are not healing well. No increased thirst or urination. Checking glucose at home. Taking medications as prescribed without any side effects. reports eye exam done in May at [facility].   Hypertension- Pt denies chest pain, SOB, dizziness, or heart palpitations.  Taking meds as directed w/o problems.  Denies medication side effects.    CKD 3- no change in urination.   Has some arthritis and wants to know what ot use for safe daily use.    Review of Systems     Objective:   Physical Exam  Constitutional: He is oriented to person, place, and time. He appears well-developed and well-nourished.  HENT:  Head: Normocephalic and atraumatic.  Cardiovascular: Normal rate, regular rhythm and normal heart sounds.   Pulmonary/Chest: Effort normal and breath sounds normal.  Neurological: He is alert and oriented to person, place, and time.  Skin: Skin is warm and dry.  Psychiatric: He has a normal mood and affect. His behavior is normal.          Assessment & Plan:  DM- well controlled with A1C of 6.0.  Continue current regimen. Follow up in 3-4 months.  HTN - well controlled. Continue her current regimen. Follow-up in 34 months.  CKD 3-recheck kidney function today.  OA - recommend Tylenol 8 hour Arthritis.

## 2014-08-11 ENCOUNTER — Encounter: Payer: Self-pay | Admitting: Family Medicine

## 2014-08-13 ENCOUNTER — Other Ambulatory Visit: Payer: Self-pay | Admitting: Family Medicine

## 2014-08-17 DIAGNOSIS — E1121 Type 2 diabetes mellitus with diabetic nephropathy: Secondary | ICD-10-CM | POA: Diagnosis not present

## 2014-08-17 DIAGNOSIS — E1129 Type 2 diabetes mellitus with other diabetic kidney complication: Secondary | ICD-10-CM | POA: Diagnosis not present

## 2014-08-18 LAB — BASIC METABOLIC PANEL
BUN: 14 mg/dL (ref 7–25)
CHLORIDE: 106 meq/L (ref 98–110)
CO2: 26 mEq/L (ref 20–31)
Calcium: 9.1 mg/dL (ref 8.6–10.3)
Creat: 1.06 mg/dL (ref 0.70–1.18)
Glucose, Bld: 100 mg/dL — ABNORMAL HIGH (ref 65–99)
Potassium: 4.3 mEq/L (ref 3.5–5.3)
SODIUM: 144 meq/L (ref 135–146)

## 2014-08-18 NOTE — Progress Notes (Signed)
Quick Note:  All labs are normal. ______ 

## 2014-09-02 ENCOUNTER — Encounter: Payer: TRICARE For Life (TFL) | Admitting: Family Medicine

## 2014-09-07 ENCOUNTER — Encounter: Payer: Self-pay | Admitting: Family Medicine

## 2014-09-07 ENCOUNTER — Ambulatory Visit (INDEPENDENT_AMBULATORY_CARE_PROVIDER_SITE_OTHER): Payer: Medicare Other | Admitting: Family Medicine

## 2014-09-07 VITALS — BP 138/85 | HR 74 | Wt 239.0 lb

## 2014-09-07 DIAGNOSIS — Z0000 Encounter for general adult medical examination without abnormal findings: Secondary | ICD-10-CM | POA: Diagnosis not present

## 2014-09-07 DIAGNOSIS — R413 Other amnesia: Secondary | ICD-10-CM

## 2014-09-07 NOTE — Patient Instructions (Signed)
Keep up a regular exercise program and make sure you are eating a healthy diet Try to eat 4 servings of dairy a day, or if you are lactose intolerant take a calcium with vitamin D daily.  Your vaccines are up to date.   

## 2014-09-07 NOTE — Progress Notes (Signed)
Subjective:    Joshua Scott is a 80 y.o. male who presents for Medicare Annual/Subsequent preventive examination.   Preventive Screening-Counseling & Management  Tobacco History  Smoking status  . Former Smoker  . Quit date: 01/23/1984  Smokeless tobacco  . Not on file    Problems Prior to Visit 1.  His wife mention today that she's noticed over the last 6 months that there has been a decline to his memory. They've tried an over-the-counter supplement for the last year but it doesn't seem to really be helping. She's also noticed that he's been a little bit more socially withdrawn not going out with friends. But she doesn't really think he is depressed per se. He mostly sits around and watches television including ballgames. He very rarely reads but occasionally will read the newspaper.  Current Problems (verified) Patient Active Problem List   Diagnosis Date Noted  . Primary osteoarthritis involving multiple joints 08/02/2014  . CAD (coronary artery disease) 09/02/2013  . Chest pain 07/08/2013  . Essential hypertension 07/08/2013  . Diabetes mellitus with renal manifestations, controlled 10/06/2009  . HYPERLIPIDEMIA 10/06/2009  . Gout 10/06/2009  . CHRONIC KIDNEY DISEASE STAGE III (MODERATE) 10/06/2009  . BENIGN PROSTATIC HYPERTROPHY, HX OF 10/06/2009    Medications Prior to Visit Current Outpatient Prescriptions on File Prior to Visit  Medication Sig Dispense Refill  . allopurinol (ZYLOPRIM) 300 MG tablet TAKE 1 TABLET DAILY 90 tablet 1  . aspirin EC 81 MG tablet Take 81 mg by mouth daily.    Marland Kitchen atorvastatin (LIPITOR) 80 MG tablet TAKE 1 TABLET DAILY 90 tablet 3  . doxazosin (CARDURA) 4 MG tablet TAKE 1 TABLET AT BEDTIME 90 tablet 3  . glipiZIDE (GLIPIZIDE XL) 5 MG 24 hr tablet Take 1 tablet (5 mg total) by mouth daily. 90 tablet 1  . saxagliptin HCl (ONGLYZA) 5 MG TABS tablet Take 5 mg by mouth daily.    . TOPROL XL 25 MG 24 hr tablet TAKE 1 TABLET DAILY 90 tablet 3  .  valsartan (DIOVAN) 320 MG tablet TAKE 1 TABLET DAILY 90 tablet 2   No current facility-administered medications on file prior to visit.    Current Medications (verified) Current Outpatient Prescriptions  Medication Sig Dispense Refill  . allopurinol (ZYLOPRIM) 300 MG tablet TAKE 1 TABLET DAILY 90 tablet 1  . aspirin EC 81 MG tablet Take 81 mg by mouth daily.    Marland Kitchen atorvastatin (LIPITOR) 80 MG tablet TAKE 1 TABLET DAILY 90 tablet 3  . doxazosin (CARDURA) 4 MG tablet TAKE 1 TABLET AT BEDTIME 90 tablet 3  . glipiZIDE (GLIPIZIDE XL) 5 MG 24 hr tablet Take 1 tablet (5 mg total) by mouth daily. 90 tablet 1  . saxagliptin HCl (ONGLYZA) 5 MG TABS tablet Take 5 mg by mouth daily.    . TOPROL XL 25 MG 24 hr tablet TAKE 1 TABLET DAILY 90 tablet 3  . valsartan (DIOVAN) 320 MG tablet TAKE 1 TABLET DAILY 90 tablet 2   No current facility-administered medications for this visit.     Allergies (verified) Metformin and related   PAST HISTORY  Family History Family History  Problem Relation Age of Onset  . Diabetes Mother     Social History Social History  Substance Use Topics  . Smoking status: Former Smoker    Quit date: 01/23/1984  . Smokeless tobacco: Not on file  . Alcohol Use: No    Are there smokers in your home (other than you)?  No  Risk Factors Current exercise habits: walks some for exercise  Dietary issues discussed: no concerns   Cardiac risk factors: advanced age (older than 69 for men, 54 for women), diabetes mellitus, hypertension and sedentary lifestyle.  Depression Screen (Note: if answer to either of the following is "Yes", a more complete depression screening is indicated)   Q1: Over the past two weeks, have you felt down, depressed or hopeless? No  Q2: Over the past two weeks, have you felt little interest or pleasure in doing things? No  Have you lost interest or pleasure in daily life? No  Do you often feel hopeless? No  Do you cry easily over simple  problems? No  Activities of Daily Living In your present state of health, do you have any difficulty performing the following activities?:  Driving? No Managing money?  No Feeding yourself? No Getting from bed to chair? No Climbing a flight of stairs? Yes Preparing food and eating?: No Bathing or showering? No Getting dressed: No Getting to the toilet? No Using the toilet:No Moving around from place to place: No In the past year have you fallen or had a near fall?:No   Are you sexually active?  No  Do you have more than one partner?  No  Hearing Difficulties: Yes Do you often ask people to speak up or repeat themselves? Yes Do you experience ringing or noises in your ears? No Do you have difficulty understanding soft or whispered voices? Yes   Do you feel that you have a problem with memory? Yes  Do you often misplace items? No  Do you feel safe at home?  Yes  Cognitive Testing  6 CIT Score of 17/28 today ( abnormal)     Advanced Directives have been discussed with the patient? No   List the Names of Other Physician/Practitioners you currently use: 1.  Dr. Kirk Ruths - cardiology.  2. Dr. Hartley Barefoot   Indicate any recent Medical Services you may have received from other than [facility] providers in the past year (date may be approximate).  Immunization History  Administered Date(s) Administered  . Influenza Split 10/23/2011  . Influenza Whole 10/05/2009  . Influenza,inj,Quad PF,36+ Mos 10/02/2013  . Influenza-Unspecified 11/17/2012  . Pneumococcal Conjugate-13 04/02/2014  . Pneumococcal Polysaccharide-23 01/23/2008  . Tdap 04/25/2012    Screening Tests Health Maintenance  Topic Date Due  . COLONOSCOPY  01/22/2014  . INFLUENZA VACCINE  08/23/2014  . HEMOGLOBIN A1C  02/02/2015  . URINE MICROALBUMIN  02/05/2015  . FOOT EXAM  04/02/2015  . OPHTHALMOLOGY EXAM  05/13/2015  . TETANUS/TDAP  04/26/2022  . ZOSTAVAX  Completed  . PNA vac Low Risk Adult  Completed     All answers were reviewed with the patient and necessary referrals were made:  METHENEY,CATHERINE, MD   09/07/2014   History reviewed: allergies, current medications, past family history, past medical history, past social history, past surgical history and problem list  Review of Systems A comprehensive review of systems was negative.    Objective:     Vision by Snellen chart: [place] for recent Eye exam   Blood pressure 138/85, pulse 74, weight 239 lb (108.41 kg). Body mass index is 28.34 kg/(m^2).  BP 138/85 mmHg  Pulse 74  Wt 239 lb (108.41 kg) General appearance: alert, cooperative and appears stated age Head: Normocephalic, without obvious abnormality, atraumatic Eyes: conj clear, EOMI, PEERLA Ears: normal TM's and external ear canals both ears Nose: Nares normal. Septum midline. Mucosa normal. No  drainage or sinus tenderness. Throat: lips, mucosa, and tongue normal; teeth and gums normal Neck: no adenopathy, no carotid bruit, no JVD, supple, symmetrical, trachea midline and thyroid not enlarged, symmetric, no tenderness/mass/nodules Back: symmetric, no curvature. ROM normal. No CVA tenderness. Lungs: clear to auscultation bilaterally Chest wall: no tenderness Heart: regular rate and rhythm, S1, S2 normal, no murmur, click, rub or gallop Abdomen: soft, non-tender; bowel sounds normal; no masses,  no organomegaly Extremities: extremities normal, atraumatic, no cyanosis or edema and ganglion cyst on the right anterior wrist.   Pulses: 2+ and symmetric Skin: Skin color, texture, turgor normal. No rashes or lesions Lymph nodes: Cervical, supraclavicular, and axillary nodes normal. Neurologic: Alert and oriented X 3, normal strength and tone. Normal symmetric reflexes. Normal coordination and gait     Assessment:     Annual wellness exam.       Plan:     During the course of the visit the patient was educated and counseled about appropriate screening  and preventive services including:    Influenza vaccine - he wants to hold off until oct   Memory change - no family history of Alzheimers in teh family.  N ohx of head trauma. Will schedule memory testing. Extra labs ordered.    Diet review for nutrition referral? Yes ____  Not Indicated _X_   Patient Instructions (the written plan) was given to the patient.  Medicare Attestation I have personally reviewed: The patient's medical and social history Their use of alcohol, tobacco or illicit drugs Their current medications and supplements The patient's functional ability including ADLs,fall risks, home safety risks, cognitive, and hearing and visual impairment Diet and physical activities Evidence for depression or mood disorders  The patient's weight, height, BMI, and visual acuity have been recorded in the chart.  I have made referrals, counseling, and provided education to the patient based on review of the above and I have provided the patient with a written personalized care plan for preventive services.     METHENEY,CATHERINE, MD   09/07/2014

## 2014-09-17 ENCOUNTER — Other Ambulatory Visit: Payer: Self-pay | Admitting: Family Medicine

## 2014-10-11 ENCOUNTER — Ambulatory Visit: Payer: TRICARE For Life (TFL) | Admitting: Family Medicine

## 2014-10-25 ENCOUNTER — Ambulatory Visit (INDEPENDENT_AMBULATORY_CARE_PROVIDER_SITE_OTHER): Payer: Medicare Other | Admitting: Family Medicine

## 2014-10-25 VITALS — Temp 98.6°F

## 2014-10-25 DIAGNOSIS — Z23 Encounter for immunization: Secondary | ICD-10-CM

## 2014-10-27 ENCOUNTER — Encounter: Payer: Self-pay | Admitting: Family Medicine

## 2014-10-27 DIAGNOSIS — Z23 Encounter for immunization: Secondary | ICD-10-CM

## 2014-10-27 NOTE — Addendum Note (Signed)
Addended by: Teddy Spike on: 10/27/2014 05:22 PM   Modules accepted: Medications, Level of Service

## 2014-10-27 NOTE — Progress Notes (Signed)
Flu shot given pt tolerated well.Joshua Scott

## 2014-10-31 ENCOUNTER — Other Ambulatory Visit: Payer: Self-pay | Admitting: Family Medicine

## 2014-12-03 ENCOUNTER — Ambulatory Visit: Payer: TRICARE For Life (TFL) | Admitting: Family Medicine

## 2014-12-03 DIAGNOSIS — G300 Alzheimer's disease with early onset: Secondary | ICD-10-CM | POA: Diagnosis not present

## 2014-12-03 DIAGNOSIS — E1122 Type 2 diabetes mellitus with diabetic chronic kidney disease: Secondary | ICD-10-CM | POA: Diagnosis not present

## 2014-12-03 DIAGNOSIS — R0989 Other specified symptoms and signs involving the circulatory and respiratory systems: Secondary | ICD-10-CM | POA: Diagnosis not present

## 2014-12-03 DIAGNOSIS — R404 Transient alteration of awareness: Secondary | ICD-10-CM | POA: Diagnosis not present

## 2014-12-03 DIAGNOSIS — R079 Chest pain, unspecified: Secondary | ICD-10-CM | POA: Diagnosis not present

## 2014-12-03 DIAGNOSIS — R930 Abnormal findings on diagnostic imaging of skull and head, not elsewhere classified: Secondary | ICD-10-CM | POA: Diagnosis not present

## 2014-12-03 DIAGNOSIS — G459 Transient cerebral ischemic attack, unspecified: Secondary | ICD-10-CM | POA: Diagnosis not present

## 2014-12-03 DIAGNOSIS — R4701 Aphasia: Secondary | ICD-10-CM | POA: Diagnosis not present

## 2014-12-03 DIAGNOSIS — F0280 Dementia in other diseases classified elsewhere without behavioral disturbance: Secondary | ICD-10-CM | POA: Diagnosis not present

## 2014-12-03 DIAGNOSIS — I639 Cerebral infarction, unspecified: Secondary | ICD-10-CM | POA: Diagnosis not present

## 2014-12-03 DIAGNOSIS — Q211 Atrial septal defect: Secondary | ICD-10-CM | POA: Diagnosis not present

## 2014-12-03 DIAGNOSIS — N183 Chronic kidney disease, stage 3 (moderate): Secondary | ICD-10-CM | POA: Diagnosis not present

## 2014-12-03 DIAGNOSIS — R51 Headache: Secondary | ICD-10-CM | POA: Diagnosis not present

## 2014-12-03 DIAGNOSIS — R42 Dizziness and giddiness: Secondary | ICD-10-CM | POA: Diagnosis not present

## 2014-12-04 DIAGNOSIS — Z888 Allergy status to other drugs, medicaments and biological substances status: Secondary | ICD-10-CM | POA: Diagnosis not present

## 2014-12-04 DIAGNOSIS — Z79899 Other long term (current) drug therapy: Secondary | ICD-10-CM | POA: Diagnosis not present

## 2014-12-04 DIAGNOSIS — I6521 Occlusion and stenosis of right carotid artery: Secondary | ICD-10-CM | POA: Diagnosis not present

## 2014-12-04 DIAGNOSIS — N183 Chronic kidney disease, stage 3 (moderate): Secondary | ICD-10-CM | POA: Diagnosis present

## 2014-12-04 DIAGNOSIS — I639 Cerebral infarction, unspecified: Secondary | ICD-10-CM | POA: Diagnosis present

## 2014-12-04 DIAGNOSIS — F0280 Dementia in other diseases classified elsewhere without behavioral disturbance: Secondary | ICD-10-CM | POA: Diagnosis present

## 2014-12-04 DIAGNOSIS — I080 Rheumatic disorders of both mitral and aortic valves: Secondary | ICD-10-CM | POA: Diagnosis not present

## 2014-12-04 DIAGNOSIS — Z7982 Long term (current) use of aspirin: Secondary | ICD-10-CM | POA: Diagnosis not present

## 2014-12-04 DIAGNOSIS — Q211 Atrial septal defect: Secondary | ICD-10-CM | POA: Diagnosis not present

## 2014-12-04 DIAGNOSIS — I519 Heart disease, unspecified: Secondary | ICD-10-CM | POA: Diagnosis not present

## 2014-12-04 DIAGNOSIS — I129 Hypertensive chronic kidney disease with stage 1 through stage 4 chronic kidney disease, or unspecified chronic kidney disease: Secondary | ICD-10-CM | POA: Diagnosis present

## 2014-12-04 DIAGNOSIS — I77810 Thoracic aortic ectasia: Secondary | ICD-10-CM | POA: Diagnosis not present

## 2014-12-04 DIAGNOSIS — G459 Transient cerebral ischemic attack, unspecified: Secondary | ICD-10-CM | POA: Diagnosis not present

## 2014-12-04 DIAGNOSIS — G300 Alzheimer's disease with early onset: Secondary | ICD-10-CM | POA: Diagnosis present

## 2014-12-04 DIAGNOSIS — I659 Occlusion and stenosis of unspecified precerebral artery: Secondary | ICD-10-CM | POA: Diagnosis not present

## 2014-12-04 DIAGNOSIS — I517 Cardiomegaly: Secondary | ICD-10-CM | POA: Diagnosis not present

## 2014-12-04 DIAGNOSIS — R4701 Aphasia: Secondary | ICD-10-CM | POA: Diagnosis present

## 2014-12-04 DIAGNOSIS — E1122 Type 2 diabetes mellitus with diabetic chronic kidney disease: Secondary | ICD-10-CM | POA: Diagnosis present

## 2014-12-06 DIAGNOSIS — I129 Hypertensive chronic kidney disease with stage 1 through stage 4 chronic kidney disease, or unspecified chronic kidney disease: Secondary | ICD-10-CM | POA: Diagnosis not present

## 2014-12-06 DIAGNOSIS — Z888 Allergy status to other drugs, medicaments and biological substances status: Secondary | ICD-10-CM | POA: Diagnosis not present

## 2014-12-06 DIAGNOSIS — R0602 Shortness of breath: Secondary | ICD-10-CM | POA: Diagnosis not present

## 2014-12-06 DIAGNOSIS — I771 Stricture of artery: Secondary | ICD-10-CM | POA: Diagnosis not present

## 2014-12-06 DIAGNOSIS — E119 Type 2 diabetes mellitus without complications: Secondary | ICD-10-CM | POA: Diagnosis not present

## 2014-12-06 DIAGNOSIS — Z7982 Long term (current) use of aspirin: Secondary | ICD-10-CM | POA: Diagnosis not present

## 2014-12-06 DIAGNOSIS — Z7984 Long term (current) use of oral hypoglycemic drugs: Secondary | ICD-10-CM | POA: Diagnosis not present

## 2014-12-06 DIAGNOSIS — Z79899 Other long term (current) drug therapy: Secondary | ICD-10-CM | POA: Diagnosis not present

## 2014-12-06 DIAGNOSIS — G300 Alzheimer's disease with early onset: Secondary | ICD-10-CM | POA: Diagnosis not present

## 2014-12-06 DIAGNOSIS — F0280 Dementia in other diseases classified elsewhere without behavioral disturbance: Secondary | ICD-10-CM | POA: Diagnosis not present

## 2014-12-06 DIAGNOSIS — N183 Chronic kidney disease, stage 3 (moderate): Secondary | ICD-10-CM | POA: Diagnosis not present

## 2014-12-07 ENCOUNTER — Ambulatory Visit (INDEPENDENT_AMBULATORY_CARE_PROVIDER_SITE_OTHER): Payer: Medicare Other | Admitting: Osteopathic Medicine

## 2014-12-07 ENCOUNTER — Encounter: Payer: Self-pay | Admitting: Osteopathic Medicine

## 2014-12-07 ENCOUNTER — Ambulatory Visit (INDEPENDENT_AMBULATORY_CARE_PROVIDER_SITE_OTHER): Payer: Medicare Other

## 2014-12-07 VITALS — BP 149/94 | HR 102 | Wt 231.0 lb

## 2014-12-07 DIAGNOSIS — I129 Hypertensive chronic kidney disease with stage 1 through stage 4 chronic kidney disease, or unspecified chronic kidney disease: Secondary | ICD-10-CM | POA: Diagnosis not present

## 2014-12-07 DIAGNOSIS — G300 Alzheimer's disease with early onset: Secondary | ICD-10-CM | POA: Diagnosis not present

## 2014-12-07 DIAGNOSIS — I69351 Hemiplegia and hemiparesis following cerebral infarction affecting right dominant side: Secondary | ICD-10-CM | POA: Diagnosis not present

## 2014-12-07 DIAGNOSIS — R0602 Shortness of breath: Secondary | ICD-10-CM

## 2014-12-07 DIAGNOSIS — I6350 Cerebral infarction due to unspecified occlusion or stenosis of unspecified cerebral artery: Secondary | ICD-10-CM | POA: Diagnosis not present

## 2014-12-07 DIAGNOSIS — E119 Type 2 diabetes mellitus without complications: Secondary | ICD-10-CM | POA: Diagnosis not present

## 2014-12-07 DIAGNOSIS — F0280 Dementia in other diseases classified elsewhere without behavioral disturbance: Secondary | ICD-10-CM | POA: Diagnosis not present

## 2014-12-07 DIAGNOSIS — I69320 Aphasia following cerebral infarction: Secondary | ICD-10-CM | POA: Diagnosis not present

## 2014-12-07 DIAGNOSIS — N183 Chronic kidney disease, stage 3 (moderate): Secondary | ICD-10-CM | POA: Diagnosis not present

## 2014-12-07 MED ORDER — ATORVASTATIN CALCIUM 80 MG PO TABS: 80.0000 mg | ORAL_TABLET | Freq: Every day | ORAL | Status: DC

## 2014-12-07 NOTE — Patient Instructions (Signed)
Shortness of Breath Shortness of breath means you have trouble breathing. It could also mean that you have a medical problem. You should get immediate medical care for shortness of breath. CAUSES   Not enough oxygen in the air such as with high altitudes or a smoke-filled room.  Certain lung diseases, infections, or problems.  Heart disease or conditions, such as angina or heart failure.  Low red blood cells (anemia).  Poor physical fitness, which can cause shortness of breath when you exercise.  Chest or back injuries or stiffness.  Being overweight.  Smoking.  Anxiety, which can make you feel like you are not getting enough air. DIAGNOSIS  Serious medical problems can often be found during your physical exam. Tests may also be done to determine why you are having shortness of breath. Tests may include:  Chest X-rays.  Lung function tests.  Blood tests.  An electrocardiogram (ECG).  An ambulatory electrocardiogram. An ambulatory ECG records your heartbeat patterns over a 24-hour period.  Exercise testing.  A transthoracic echocardiogram (TTE). During echocardiography, sound waves are used to evaluate how blood flows through your heart.  A transesophageal echocardiogram (TEE).  Imaging scans. Your health care provider may not be able to find a cause for your shortness of breath after your exam. In this case, it is important to have a follow-up exam with your health care provider as directed.  TREATMENT  Treatment for shortness of breath depends on the cause of your symptoms and can vary greatly. HOME CARE INSTRUCTIONS   Do not smoke. Smoking is a common cause of shortness of breath. If you smoke, ask for help to quit.  Avoid being around chemicals or things that may bother your breathing, such as paint fumes and dust.  Rest as needed. Slowly resume your usual activities.  If medicines were prescribed, take them as directed for the full length of time directed. This  includes oxygen and any inhaled medicines.  Keep all follow-up appointments as directed by your health care provider. SEEK MEDICAL CARE IF:   Your condition does not improve in the time expected.  You have a hard time doing your normal activities even with rest.  You have any new symptoms. SEEK IMMEDIATE MEDICAL CARE IF:   Your shortness of breath gets worse.  You feel light-headed, faint, or develop a cough not controlled with medicines.  You start coughing up blood.  You have pain with breathing.  You have chest pain or pain in your arms, shoulders, or abdomen.  You have a fever.  You are unable to walk up stairs or exercise the way you normally do. MAKE SURE YOU:  Understand these instructions.  Will watch your condition.  Will get help right away if you are not doing well or get worse.   This information is not intended to replace advice given to you by your health care provider. Make sure you discuss any questions you have with your health care provider.   Document Released: 10/03/2000 Document Revised: 01/13/2013 Document Reviewed: 03/26/2011 Elsevier Interactive Patient Education 2016 [employer].    Insomnia Insomnia is a sleep disorder that makes it difficult to fall asleep or to stay asleep. Insomnia can cause tiredness (fatigue), low energy, difficulty concentrating, mood swings, and poor performance at work or school.  There are three different ways to classify insomnia:  Difficulty falling asleep.  Difficulty staying asleep.  Waking up too early in the morning. Any type of insomnia can be long-term (chronic) or short-term (  acute). Both are common. Short-term insomnia usually lasts for three months or less. Chronic insomnia occurs at least three times a week for longer than three months. CAUSES  Insomnia may be caused by another condition, situation, or substance, such as:  Anxiety.  Certain medicines.  Gastroesophageal reflux disease (GERD) or  other gastrointestinal conditions.  Asthma or other breathing conditions.  Restless legs syndrome, sleep apnea, or other sleep disorders.  Chronic pain.  Menopause. This may include hot flashes.  Stroke.  Abuse of alcohol, tobacco, or illegal drugs.  Depression.  Caffeine.   Neurological disorders, such as Alzheimer disease.  An overactive thyroid (hyperthyroidism). The cause of insomnia may not be known. RISK FACTORS Risk factors for insomnia include:  Gender. Women are more commonly affected than men.  Age. Insomnia is more common as you get older.  Stress. This may involve your professional or personal life.  Income. Insomnia is more common in people with lower income.  Lack of exercise.   Irregular work schedule or night shifts.  Traveling between different time zones. SIGNS AND SYMPTOMS If you have insomnia, trouble falling asleep or trouble staying asleep is the main symptom. This may lead to other symptoms, such as:  Feeling fatigued.  Feeling nervous about going to sleep.  Not feeling rested in the morning.  Having trouble concentrating.  Feeling irritable, anxious, or depressed. TREATMENT  Treatment for insomnia depends on the cause. If your insomnia is caused by an underlying condition, treatment will focus on addressing the condition. Treatment may also include:   Medicines to help you sleep.  Counseling or therapy.  Lifestyle adjustments. HOME CARE INSTRUCTIONS   Take medicines only as directed by your health care provider.  Keep regular sleeping and waking hours. Avoid naps.  Keep a sleep diary to help you and your health care provider figure out what could be causing your insomnia. Include:   When you sleep.  When you wake up during the night.  How well you sleep.   How rested you feel the next day.  Any side effects of medicines you are taking.  What you eat and drink.   Make your bedroom a comfortable place where it is  easy to fall asleep:  Put up shades or special blackout curtains to block light from outside.  Use a white noise machine to block noise.  Keep the temperature cool.   Exercise regularly as directed by your health care provider. Avoid exercising right before bedtime.  Use relaxation techniques to manage stress. Ask your health care provider to suggest some techniques that may work well for you. These may include:  Breathing exercises.  Routines to release muscle tension.  Visualizing peaceful scenes.  Cut back on alcohol, caffeinated beverages, and cigarettes, especially close to bedtime. These can disrupt your sleep.  Do not overeat or eat spicy foods right before bedtime. This can lead to digestive discomfort that can make it hard for you to sleep.  Limit screen use before bedtime. This includes:  Watching TV.  Using your smartphone, tablet, and computer.  Stick to a routine. This can help you fall asleep faster. Try to do a quiet activity, brush your teeth, and go to bed at the same time each night.  Get out of bed if you are still awake after 15 minutes of trying to sleep. Keep the lights down, but try reading or doing a quiet activity. When you feel sleepy, go back to bed.  Make sure that you drive carefully.  Avoid driving if you feel very sleepy.  Keep all follow-up appointments as directed by your health care provider. This is important. SEEK MEDICAL CARE IF:   You are tired throughout the day or have trouble in your daily routine due to sleepiness.  You continue to have sleep problems or your sleep problems get worse. SEEK IMMEDIATE MEDICAL CARE IF:   You have serious thoughts about hurting yourself or someone else.   This information is not intended to replace advice given to you by your health care provider. Make sure you discuss any questions you have with your health care provider.   Document Released: 01/06/2000 Document Revised: 09/29/2014 Document  Reviewed: 10/09/2013 Elsevier Interactive Patient Education [employer].

## 2014-12-07 NOTE — Progress Notes (Signed)
HPI: Joshua Scott is a 80 y.o. male who presents to [place]  today for chief complaint of:  Chief Complaint  Patient presents with  . Shortness of Breath    . Severity: mild to moderate . Duration: since out of the hospital . Timing: constant . Context: Recently hospitalized 12/03/2014 to 12/05/2014, treated for stroke. Seen in the emergency room yesterday 12/06/2014 for shortness of breath. A family member states that he has been short of breath since out of the hospital, seems to be worse with exertion, no chest pain. No history of lung disease, remote history of smoking. . Modifying factors: Patient is not on inhalers at home, no history of lung problems, never had to be on oxygen. . Assoc signs/symptoms: No chest pain, family member reports chronic mild cough, this has not gotten worse. No lower extremity swelling. No difficulty swallowing  ER and hospital records reviewed below.    Past medical, social and family history reviewed: Past Medical History  Diagnosis Date  . Hyperlipidemia   . Diabetes mellitus   . Hypertension   . Gout   . ED (erectile dysfunction)   . CAD (coronary artery disease)    Past Surgical History  Procedure Laterality Date  . Cataract extraction w/ intraocular lens implant  06/14/11    Left eye  . Cataract extraction w/ intraocular lens implant  07/03/11    Right eye  . Left heart catheterization with coronary angiogram N/A 07/14/2013    Procedure: LEFT HEART CATHETERIZATION WITH CORONARY ANGIOGRAM;  Surgeon: Peter M Martinique, MD;  Location: [facility] CATH LAB;  Service: Cardiovascular;  Laterality: N/A;   Social History  Substance Use Topics  . Smoking status: Former Smoker    Quit date: 01/23/1984  . Smokeless tobacco: Not on file  . Alcohol Use: No   Family History  Problem Relation Age of Onset  . Diabetes Mother     Current Outpatient Prescriptions  Medication Sig Dispense Refill  . allopurinol (ZYLOPRIM) 300  MG tablet TAKE 1 TABLET DAILY 90 tablet 0  . aspirin EC 81 MG tablet Take 81 mg by mouth daily.    Marland Kitchen atorvastatin (LIPITOR) 80 MG tablet TAKE 1 TABLET DAILY 90 tablet 3  . doxazosin (CARDURA) 4 MG tablet TAKE 1 TABLET AT BEDTIME 90 tablet 1  . glipiZIDE (GLIPIZIDE XL) 5 MG 24 hr tablet Take 1 tablet (5 mg total) by mouth daily. 90 tablet 1  . saxagliptin HCl (ONGLYZA) 5 MG TABS tablet Take 5 mg by mouth daily.    . TOPROL XL 25 MG 24 hr tablet TAKE 1 TABLET DAILY 90 tablet 3  . valsartan (DIOVAN) 320 MG tablet TAKE 1 TABLET DAILY 90 tablet 2   No current facility-administered medications for this visit.   Allergies  Allergen Reactions  . Metformin And Related Other (See Comments)    CKD 3 cuased worsening renal level.       Review of Systems: CONSTITUTIONAL:  No  fever, no chills, No  unintentional weight changes HEAD/EYES/EARS/NOSE/THROAT: No headache, no vision change, no hearing change, No  sore throat CARDIAC: No chest pain, no pressure/palpitations, no orthopnea RESPIRATORY: chronic mild cough, Yes  shortness of breath/wheeze as noted in HPI NEUROLOGIC: (+) generalized weakness, no dizziness, no slurred speech PSYCHIATRIC: No concerns with depression, (+) concerns with anxiety as noted by family member, (+) sleep problems    Exam:  BP 149/94 mmHg  Pulse 92  Wt 231 lb (104.781 kg)  SpO2  93% Constitutional: VSS, see above. General Appearance: alert, well-developed, well-nourished, NAD Eyes: Normal lids and conjunctive, non-icteric sclera, PERRLA Neck: No masses, trachea midline. No thyroid enlargement/tenderness/mass appreciated. No lymphadenopathy Respiratory: Normal respiratory effort. no wheeze, no rhonchi, no rales Cardiovascular: S1/S2 normal, no murmur, no rub/gallop auscultated. RRR. No carotid bruit or JVD. No abdominal aortic bruit. Pedal pulse II/IV bilaterally DP and PT. No lower extremity edema. Musculoskeletal: Gait normal. No clubbing/cyanosis of digits.   Neurological: No cranial nerve deficit on limited exam. Motor and sensation intact and symmetric Psychiatric: Normal judgment/insight. Normal mood and affect. Oriented x3.    No results found for this or any previous visit (from the past 72 hour(s)).  Hospitalization: MRI brain showed acute right parietal CVA. Echo showed PFO. Carotid ultrasound negative for severe stenosis. Home physical therapy was ordered, patient passed swallowing test. Patient's aspirin was increased. Management of medications for dementia/memory loss were deferred to PCP.  ER visit: D-dimer is 0.3, negative. Cardiac enzymes negative. No concerns on labs otherwise. Chest x-ray was negative. EKG normal.    ASSESSMENT/PLAN:  Cerebral infarction due to cerebral artery occlusion (HCC) - no significant debilitating sequela  Shortness of breath - Plan: DG Chest 2 View   Chest x-ray on personal review shows no mass or infiltrate concerning for pneumonia, (+) possible minimal atelectasis, no pulmonary edema or pleural effusion. Await radiology over read.   Cardiac workup as noted above, no concerns for congestive heart failure. Lung imaging and d-dimer negative, normal heart rate, no concern for pulmonary embolus.   Strongest concern is for deconditioning due to hospital stay in elderly/debilitated individual, family members seems concerned about an anxiety component as well, states patient has not been sleeping well at all.   6 minute walk done in the office today, maintained SaO2 at 97%.   Recommend follow-up with Dr. Suzi Roots, may consider pulmonary function tests the patient is not improving with physical therapy for patient is getting worse he needs to go back to the emergency room. Family member confirms that home physical therapy has been arranged and is coming out to the house to work with the patient this afternoon.  ER precautions reviewed with regard to increasing shortness of breath, particularly if concurrent  chest pain, or other concerns.   Return sooner or go to ER if symptoms worsen or fail to improve, for ASAP, [place].

## 2014-12-08 DIAGNOSIS — I69351 Hemiplegia and hemiparesis following cerebral infarction affecting right dominant side: Secondary | ICD-10-CM | POA: Diagnosis not present

## 2014-12-08 DIAGNOSIS — I69320 Aphasia following cerebral infarction: Secondary | ICD-10-CM | POA: Diagnosis not present

## 2014-12-08 DIAGNOSIS — E119 Type 2 diabetes mellitus without complications: Secondary | ICD-10-CM | POA: Diagnosis not present

## 2014-12-08 DIAGNOSIS — I129 Hypertensive chronic kidney disease with stage 1 through stage 4 chronic kidney disease, or unspecified chronic kidney disease: Secondary | ICD-10-CM | POA: Diagnosis not present

## 2014-12-08 DIAGNOSIS — F0280 Dementia in other diseases classified elsewhere without behavioral disturbance: Secondary | ICD-10-CM | POA: Diagnosis not present

## 2014-12-08 DIAGNOSIS — G300 Alzheimer's disease with early onset: Secondary | ICD-10-CM | POA: Diagnosis not present

## 2014-12-09 ENCOUNTER — Ambulatory Visit (INDEPENDENT_AMBULATORY_CARE_PROVIDER_SITE_OTHER): Payer: Medicare Other | Admitting: Family Medicine

## 2014-12-09 ENCOUNTER — Encounter: Payer: Self-pay | Admitting: Family Medicine

## 2014-12-09 ENCOUNTER — Telehealth: Payer: Self-pay

## 2014-12-09 ENCOUNTER — Ambulatory Visit: Payer: TRICARE For Life (TFL) | Admitting: Family Medicine

## 2014-12-09 VITALS — BP 133/84 | HR 95 | Wt 230.8 lb

## 2014-12-09 DIAGNOSIS — I6350 Cerebral infarction due to unspecified occlusion or stenosis of unspecified cerebral artery: Secondary | ICD-10-CM

## 2014-12-09 DIAGNOSIS — I10 Essential (primary) hypertension: Secondary | ICD-10-CM

## 2014-12-09 DIAGNOSIS — I2510 Atherosclerotic heart disease of native coronary artery without angina pectoris: Secondary | ICD-10-CM

## 2014-12-09 DIAGNOSIS — G4700 Insomnia, unspecified: Secondary | ICD-10-CM | POA: Diagnosis not present

## 2014-12-09 DIAGNOSIS — I639 Cerebral infarction, unspecified: Secondary | ICD-10-CM | POA: Insufficient documentation

## 2014-12-09 DIAGNOSIS — I2583 Coronary atherosclerosis due to lipid rich plaque: Secondary | ICD-10-CM

## 2014-12-09 DIAGNOSIS — R451 Restlessness and agitation: Secondary | ICD-10-CM

## 2014-12-09 MED ORDER — HALOPERIDOL 0.5 MG PO TABS: 0.5000 mg | ORAL_TABLET | Freq: Every evening | ORAL | Status: DC | PRN

## 2014-12-09 NOTE — Progress Notes (Signed)
Subjective:    Patient ID: Joshua Scott, male    DOB: January 18, 1935, 80 y.o.   MRN: [id number]  HPI 80 year old male went to the emergency department last Friday, November 11. He was discharged 2 days later on November 13.Marland Kitchen He was diagnosed with a acute infarct within the cortex of the postcentral gyrus and within the adjacent white matter of the right parietal lobe. and discharged home. He started feeling short of breath so went back to the ED again on Monday. They did further workup for dyspnea and felt like it was probably related to anxiety. He was seen in our office the following day for still persistent shortness of breath. 6 minute walk test was normal and chest x-ray repeat was normal as well. His granddaughter is here with him today and says he's completely anxious. He is only sleeping for about 30 minutes to an hour at a time and then wakes up very fidgety. No chest pain. No shortness of breath here today. Home health has been coming and they report some mildly elevated diastolic blood pressures. No cough.  Still feels some SOB with activity.   Says his speech is getting better.  He has some weakness in his left hand.    They did try to give him Zquail ( benadryl) Says that didn't work. Seems to have make him more agitated.    They did increase his ASA in the hospital to 325mg . he has been getting home health. Occupational therapy has come out of physical therapy as scheduled for tomorrow. They have not heard from follow-up for the stroke clinic.  Upon review of emergency department notes he did have an MRI of the head which was within normal limits. Echocardiogram showed an EF of 60-65% with some mild aortic sclerosis and mild aortic root dilatation. He also had carotid Dopplers performed which did show some plaque bilaterally but no stenosis.  Review of Systems     Objective:   Physical Exam  Constitutional: He is oriented to person, place, and time. He appears well-developed and  well-nourished.  HENT:  Head: Normocephalic and atraumatic.  Neck: Neck supple. No thyromegaly present.  Cardiovascular: Normal rate, regular rhythm and normal heart sounds.   Pulmonary/Chest: Effort normal and breath sounds normal.  Musculoskeletal:  Bilateral hands with normal strength in all fingers. He does report some numbness in the third fourth and fifth digits.  Lymphadenopathy:    He has no cervical adenopathy.  Neurological: He is alert and oriented to person, place, and time.  Skin: Skin is warm and dry.  Psychiatric: He has a normal mood and affect. His behavior is normal.          Assessment & Plan:  Stroke, acute infarct in the right postcentral gyrus and right parietal lobe. He is now on full dose aspirin. He still has a little bit and numbness in his third, fourth and fifth digits on his left hand but overall has good strength on exam today.  Hypertension-his blood pressure looks fantastic today. We'll continue to monitor carefully. Extra graft shortness of breath-just gave him reassurance. His lung exam was completely normal today is 6 minute walk test on Tuesday was normal. An is actually not feeling short of breath today. He also did have an echocardiogram which showed only a little bit of mild diastolic dysfunction and he does not look volume overloaded on exam today.  Insomnia/agitation-since he had a bad reaction to the Ativan, he became combative then recommend a low-dose  trial of Haldol in the evening. Encouraged his family to monitor very carefully for any reaction side effects or excess sedation. Over this is just temporary for a few days to maybe a week at the most. I really think once his brain starts to heal that we will be able to get back on his previous sleep routine. I also think his hospital stay probably affected his circadian rhythms.

## 2014-12-09 NOTE — Telephone Encounter (Signed)
Grand daughter called stating pt has not received more that 2 hours of sleep since his stroke Friday States pt has been very anxious and restless and he's unable to get an appointment with you until next Tuesday. Would like to know what can be done until appointment or if appointment can be moved up.

## 2014-12-09 NOTE — Telephone Encounter (Signed)
We can put him in for 30 minute appointment at 4:00 today.

## 2014-12-09 NOTE — Telephone Encounter (Signed)
Called granddaughter and stated they can make today at 4 p.m.

## 2014-12-10 DIAGNOSIS — E119 Type 2 diabetes mellitus without complications: Secondary | ICD-10-CM | POA: Diagnosis not present

## 2014-12-10 DIAGNOSIS — F0280 Dementia in other diseases classified elsewhere without behavioral disturbance: Secondary | ICD-10-CM | POA: Diagnosis not present

## 2014-12-10 DIAGNOSIS — G300 Alzheimer's disease with early onset: Secondary | ICD-10-CM | POA: Diagnosis not present

## 2014-12-10 DIAGNOSIS — I69320 Aphasia following cerebral infarction: Secondary | ICD-10-CM | POA: Diagnosis not present

## 2014-12-10 DIAGNOSIS — I129 Hypertensive chronic kidney disease with stage 1 through stage 4 chronic kidney disease, or unspecified chronic kidney disease: Secondary | ICD-10-CM | POA: Diagnosis not present

## 2014-12-10 DIAGNOSIS — I69351 Hemiplegia and hemiparesis following cerebral infarction affecting right dominant side: Secondary | ICD-10-CM | POA: Diagnosis not present

## 2014-12-13 ENCOUNTER — Telehealth: Payer: Self-pay

## 2014-12-13 DIAGNOSIS — E119 Type 2 diabetes mellitus without complications: Secondary | ICD-10-CM | POA: Diagnosis not present

## 2014-12-13 DIAGNOSIS — I69320 Aphasia following cerebral infarction: Secondary | ICD-10-CM | POA: Diagnosis not present

## 2014-12-13 DIAGNOSIS — I129 Hypertensive chronic kidney disease with stage 1 through stage 4 chronic kidney disease, or unspecified chronic kidney disease: Secondary | ICD-10-CM | POA: Diagnosis not present

## 2014-12-13 DIAGNOSIS — G300 Alzheimer's disease with early onset: Secondary | ICD-10-CM | POA: Diagnosis not present

## 2014-12-13 DIAGNOSIS — F0280 Dementia in other diseases classified elsewhere without behavioral disturbance: Secondary | ICD-10-CM | POA: Diagnosis not present

## 2014-12-13 DIAGNOSIS — I69351 Hemiplegia and hemiparesis following cerebral infarction affecting right dominant side: Secondary | ICD-10-CM | POA: Diagnosis not present

## 2014-12-13 MED ORDER — TRAZODONE HCL 50 MG PO TABS: 25.0000 mg | ORAL_TABLET | Freq: Every day | ORAL | Status: DC

## 2014-12-13 NOTE — Telephone Encounter (Signed)
Wife states pt is having the opposite reaction to medication and would like to know if something could be done different, Per Dr. Madilyn Fireman Trazodone 25 mg 22mins before bed time will be rx'd.

## 2014-12-14 ENCOUNTER — Ambulatory Visit: Payer: TRICARE For Life (TFL) | Admitting: Family Medicine

## 2014-12-15 DIAGNOSIS — E119 Type 2 diabetes mellitus without complications: Secondary | ICD-10-CM | POA: Diagnosis not present

## 2014-12-15 DIAGNOSIS — I69320 Aphasia following cerebral infarction: Secondary | ICD-10-CM | POA: Diagnosis not present

## 2014-12-15 DIAGNOSIS — I69351 Hemiplegia and hemiparesis following cerebral infarction affecting right dominant side: Secondary | ICD-10-CM | POA: Diagnosis not present

## 2014-12-15 DIAGNOSIS — I2510 Atherosclerotic heart disease of native coronary artery without angina pectoris: Secondary | ICD-10-CM | POA: Diagnosis not present

## 2014-12-15 DIAGNOSIS — E782 Mixed hyperlipidemia: Secondary | ICD-10-CM | POA: Diagnosis not present

## 2014-12-15 DIAGNOSIS — I129 Hypertensive chronic kidney disease with stage 1 through stage 4 chronic kidney disease, or unspecified chronic kidney disease: Secondary | ICD-10-CM | POA: Diagnosis not present

## 2014-12-15 DIAGNOSIS — F0280 Dementia in other diseases classified elsewhere without behavioral disturbance: Secondary | ICD-10-CM | POA: Diagnosis not present

## 2014-12-15 DIAGNOSIS — G3184 Mild cognitive impairment, so stated: Secondary | ICD-10-CM | POA: Diagnosis not present

## 2014-12-15 DIAGNOSIS — I10 Essential (primary) hypertension: Secondary | ICD-10-CM | POA: Diagnosis not present

## 2014-12-15 DIAGNOSIS — G300 Alzheimer's disease with early onset: Secondary | ICD-10-CM | POA: Diagnosis not present

## 2014-12-15 DIAGNOSIS — E1149 Type 2 diabetes mellitus with other diabetic neurological complication: Secondary | ICD-10-CM | POA: Diagnosis not present

## 2014-12-15 DIAGNOSIS — I639 Cerebral infarction, unspecified: Secondary | ICD-10-CM | POA: Diagnosis not present

## 2014-12-15 DIAGNOSIS — M1000 Idiopathic gout, unspecified site: Secondary | ICD-10-CM | POA: Diagnosis not present

## 2014-12-15 DIAGNOSIS — G4701 Insomnia due to medical condition: Secondary | ICD-10-CM | POA: Diagnosis not present

## 2014-12-15 DIAGNOSIS — N401 Enlarged prostate with lower urinary tract symptoms: Secondary | ICD-10-CM | POA: Diagnosis not present

## 2014-12-18 DIAGNOSIS — Z8673 Personal history of transient ischemic attack (TIA), and cerebral infarction without residual deficits: Secondary | ICD-10-CM | POA: Diagnosis not present

## 2014-12-18 DIAGNOSIS — E7800 Pure hypercholesterolemia, unspecified: Secondary | ICD-10-CM | POA: Diagnosis not present

## 2014-12-18 DIAGNOSIS — I129 Hypertensive chronic kidney disease with stage 1 through stage 4 chronic kidney disease, or unspecified chronic kidney disease: Secondary | ICD-10-CM | POA: Diagnosis not present

## 2014-12-18 DIAGNOSIS — F419 Anxiety disorder, unspecified: Secondary | ICD-10-CM | POA: Diagnosis not present

## 2014-12-18 DIAGNOSIS — I2510 Atherosclerotic heart disease of native coronary artery without angina pectoris: Secondary | ICD-10-CM | POA: Diagnosis not present

## 2014-12-18 DIAGNOSIS — R0602 Shortness of breath: Secondary | ICD-10-CM | POA: Diagnosis not present

## 2014-12-18 DIAGNOSIS — I252 Old myocardial infarction: Secondary | ICD-10-CM | POA: Diagnosis not present

## 2014-12-18 DIAGNOSIS — Z87891 Personal history of nicotine dependence: Secondary | ICD-10-CM | POA: Diagnosis not present

## 2014-12-18 DIAGNOSIS — E119 Type 2 diabetes mellitus without complications: Secondary | ICD-10-CM | POA: Diagnosis not present

## 2014-12-18 DIAGNOSIS — Z7982 Long term (current) use of aspirin: Secondary | ICD-10-CM | POA: Diagnosis not present

## 2014-12-18 DIAGNOSIS — N183 Chronic kidney disease, stage 3 (moderate): Secondary | ICD-10-CM | POA: Diagnosis not present

## 2014-12-19 DIAGNOSIS — G4700 Insomnia, unspecified: Secondary | ICD-10-CM | POA: Diagnosis not present

## 2014-12-19 DIAGNOSIS — F419 Anxiety disorder, unspecified: Secondary | ICD-10-CM | POA: Diagnosis not present

## 2014-12-19 DIAGNOSIS — R0600 Dyspnea, unspecified: Secondary | ICD-10-CM | POA: Diagnosis not present

## 2014-12-19 DIAGNOSIS — I10 Essential (primary) hypertension: Secondary | ICD-10-CM | POA: Diagnosis not present

## 2014-12-19 DIAGNOSIS — M109 Gout, unspecified: Secondary | ICD-10-CM | POA: Diagnosis not present

## 2014-12-19 DIAGNOSIS — R0602 Shortness of breath: Secondary | ICD-10-CM | POA: Diagnosis not present

## 2014-12-20 DIAGNOSIS — I69320 Aphasia following cerebral infarction: Secondary | ICD-10-CM | POA: Diagnosis not present

## 2014-12-20 DIAGNOSIS — F0280 Dementia in other diseases classified elsewhere without behavioral disturbance: Secondary | ICD-10-CM | POA: Diagnosis not present

## 2014-12-20 DIAGNOSIS — G300 Alzheimer's disease with early onset: Secondary | ICD-10-CM | POA: Diagnosis not present

## 2014-12-20 DIAGNOSIS — I129 Hypertensive chronic kidney disease with stage 1 through stage 4 chronic kidney disease, or unspecified chronic kidney disease: Secondary | ICD-10-CM | POA: Diagnosis not present

## 2014-12-20 DIAGNOSIS — E119 Type 2 diabetes mellitus without complications: Secondary | ICD-10-CM | POA: Diagnosis not present

## 2014-12-20 DIAGNOSIS — I69351 Hemiplegia and hemiparesis following cerebral infarction affecting right dominant side: Secondary | ICD-10-CM | POA: Diagnosis not present

## 2014-12-21 DIAGNOSIS — I129 Hypertensive chronic kidney disease with stage 1 through stage 4 chronic kidney disease, or unspecified chronic kidney disease: Secondary | ICD-10-CM | POA: Diagnosis not present

## 2014-12-21 DIAGNOSIS — I69320 Aphasia following cerebral infarction: Secondary | ICD-10-CM | POA: Diagnosis not present

## 2014-12-21 DIAGNOSIS — I69351 Hemiplegia and hemiparesis following cerebral infarction affecting right dominant side: Secondary | ICD-10-CM | POA: Diagnosis not present

## 2014-12-21 DIAGNOSIS — G300 Alzheimer's disease with early onset: Secondary | ICD-10-CM | POA: Diagnosis not present

## 2014-12-21 DIAGNOSIS — E119 Type 2 diabetes mellitus without complications: Secondary | ICD-10-CM | POA: Diagnosis not present

## 2014-12-21 DIAGNOSIS — F0280 Dementia in other diseases classified elsewhere without behavioral disturbance: Secondary | ICD-10-CM | POA: Diagnosis not present

## 2014-12-22 DIAGNOSIS — G4701 Insomnia due to medical condition: Secondary | ICD-10-CM | POA: Diagnosis not present

## 2014-12-22 DIAGNOSIS — B348 Other viral infections of unspecified site: Secondary | ICD-10-CM | POA: Diagnosis not present

## 2014-12-22 DIAGNOSIS — I639 Cerebral infarction, unspecified: Secondary | ICD-10-CM | POA: Diagnosis not present

## 2014-12-22 DIAGNOSIS — F411 Generalized anxiety disorder: Secondary | ICD-10-CM | POA: Diagnosis not present

## 2014-12-22 DIAGNOSIS — G3184 Mild cognitive impairment, so stated: Secondary | ICD-10-CM | POA: Diagnosis not present

## 2014-12-22 DIAGNOSIS — E1149 Type 2 diabetes mellitus with other diabetic neurological complication: Secondary | ICD-10-CM | POA: Diagnosis not present

## 2014-12-23 ENCOUNTER — Other Ambulatory Visit: Payer: Self-pay | Admitting: Family Medicine

## 2014-12-27 DIAGNOSIS — I69320 Aphasia following cerebral infarction: Secondary | ICD-10-CM | POA: Diagnosis not present

## 2014-12-27 DIAGNOSIS — I129 Hypertensive chronic kidney disease with stage 1 through stage 4 chronic kidney disease, or unspecified chronic kidney disease: Secondary | ICD-10-CM | POA: Diagnosis not present

## 2014-12-27 DIAGNOSIS — G300 Alzheimer's disease with early onset: Secondary | ICD-10-CM | POA: Diagnosis not present

## 2014-12-27 DIAGNOSIS — I69351 Hemiplegia and hemiparesis following cerebral infarction affecting right dominant side: Secondary | ICD-10-CM | POA: Diagnosis not present

## 2014-12-27 DIAGNOSIS — E119 Type 2 diabetes mellitus without complications: Secondary | ICD-10-CM | POA: Diagnosis not present

## 2014-12-27 DIAGNOSIS — F0280 Dementia in other diseases classified elsewhere without behavioral disturbance: Secondary | ICD-10-CM | POA: Diagnosis not present

## 2014-12-28 DIAGNOSIS — Z806 Family history of leukemia: Secondary | ICD-10-CM | POA: Diagnosis not present

## 2014-12-28 DIAGNOSIS — Z9842 Cataract extraction status, left eye: Secondary | ICD-10-CM | POA: Diagnosis not present

## 2014-12-28 DIAGNOSIS — Z9889 Other specified postprocedural states: Secondary | ICD-10-CM | POA: Diagnosis not present

## 2014-12-28 DIAGNOSIS — F419 Anxiety disorder, unspecified: Secondary | ICD-10-CM | POA: Diagnosis not present

## 2014-12-28 DIAGNOSIS — R410 Disorientation, unspecified: Secondary | ICD-10-CM | POA: Diagnosis not present

## 2014-12-28 DIAGNOSIS — Z87891 Personal history of nicotine dependence: Secondary | ICD-10-CM | POA: Diagnosis not present

## 2014-12-28 DIAGNOSIS — I69354 Hemiplegia and hemiparesis following cerebral infarction affecting left non-dominant side: Secondary | ICD-10-CM | POA: Diagnosis not present

## 2014-12-28 DIAGNOSIS — I1310 Hypertensive heart and chronic kidney disease without heart failure, with stage 1 through stage 4 chronic kidney disease, or unspecified chronic kidney disease: Secondary | ICD-10-CM | POA: Diagnosis not present

## 2014-12-28 DIAGNOSIS — R002 Palpitations: Secondary | ICD-10-CM | POA: Diagnosis not present

## 2014-12-28 DIAGNOSIS — I10 Essential (primary) hypertension: Secondary | ICD-10-CM | POA: Diagnosis not present

## 2014-12-28 DIAGNOSIS — Q211 Atrial septal defect: Secondary | ICD-10-CM | POA: Diagnosis not present

## 2014-12-28 DIAGNOSIS — Z888 Allergy status to other drugs, medicaments and biological substances status: Secondary | ICD-10-CM | POA: Diagnosis not present

## 2014-12-28 DIAGNOSIS — M21371 Foot drop, right foot: Secondary | ICD-10-CM | POA: Diagnosis not present

## 2014-12-28 DIAGNOSIS — R4189 Other symptoms and signs involving cognitive functions and awareness: Secondary | ICD-10-CM | POA: Diagnosis not present

## 2014-12-28 DIAGNOSIS — I639 Cerebral infarction, unspecified: Secondary | ICD-10-CM | POA: Diagnosis not present

## 2014-12-28 DIAGNOSIS — E1169 Type 2 diabetes mellitus with other specified complication: Secondary | ICD-10-CM | POA: Diagnosis not present

## 2014-12-28 DIAGNOSIS — I2510 Atherosclerotic heart disease of native coronary artery without angina pectoris: Secondary | ICD-10-CM | POA: Diagnosis not present

## 2014-12-28 DIAGNOSIS — N183 Chronic kidney disease, stage 3 (moderate): Secondary | ICD-10-CM | POA: Diagnosis not present

## 2014-12-28 DIAGNOSIS — I252 Old myocardial infarction: Secondary | ICD-10-CM | POA: Diagnosis not present

## 2014-12-28 DIAGNOSIS — Z7982 Long term (current) use of aspirin: Secondary | ICD-10-CM | POA: Diagnosis not present

## 2014-12-28 DIAGNOSIS — R200 Anesthesia of skin: Secondary | ICD-10-CM | POA: Diagnosis not present

## 2014-12-28 DIAGNOSIS — Z9841 Cataract extraction status, right eye: Secondary | ICD-10-CM | POA: Diagnosis not present

## 2014-12-28 DIAGNOSIS — Z79899 Other long term (current) drug therapy: Secondary | ICD-10-CM | POA: Diagnosis not present

## 2014-12-28 DIAGNOSIS — E1122 Type 2 diabetes mellitus with diabetic chronic kidney disease: Secondary | ICD-10-CM | POA: Diagnosis not present

## 2014-12-28 DIAGNOSIS — Z833 Family history of diabetes mellitus: Secondary | ICD-10-CM | POA: Diagnosis not present

## 2014-12-28 DIAGNOSIS — E782 Mixed hyperlipidemia: Secondary | ICD-10-CM | POA: Diagnosis not present

## 2014-12-28 LAB — BASIC METABOLIC PANEL
BUN: 16 mg/dL (ref 4–21)
Creatinine: 1 mg/dL (ref 0.6–1.3)
GLUCOSE: 152 mg/dL
Potassium: 4.1 mmol/L (ref 3.4–5.3)
SODIUM: 138 mmol/L (ref 137–147)

## 2014-12-28 LAB — HEPATIC FUNCTION PANEL
ALT: 19 U/L (ref 10–40)
AST: 12 U/L — AB (ref 14–40)
Alkaline Phosphatase: 79 U/L (ref 25–125)
BILIRUBIN, TOTAL: 0.6 mg/dL

## 2014-12-28 LAB — LIPID PANEL
Cholesterol: 125 mg/dL (ref 0–200)
HDL: 29 mg/dL — AB (ref 35–70)
LDL CALC: 86 mg/dL
TRIGLYCERIDES: 143 mg/dL (ref 40–160)

## 2014-12-29 DIAGNOSIS — E119 Type 2 diabetes mellitus without complications: Secondary | ICD-10-CM | POA: Diagnosis not present

## 2014-12-29 DIAGNOSIS — I69351 Hemiplegia and hemiparesis following cerebral infarction affecting right dominant side: Secondary | ICD-10-CM | POA: Diagnosis not present

## 2014-12-29 DIAGNOSIS — I129 Hypertensive chronic kidney disease with stage 1 through stage 4 chronic kidney disease, or unspecified chronic kidney disease: Secondary | ICD-10-CM | POA: Diagnosis not present

## 2014-12-29 DIAGNOSIS — G300 Alzheimer's disease with early onset: Secondary | ICD-10-CM | POA: Diagnosis not present

## 2014-12-29 DIAGNOSIS — I69320 Aphasia following cerebral infarction: Secondary | ICD-10-CM | POA: Diagnosis not present

## 2014-12-29 DIAGNOSIS — F0280 Dementia in other diseases classified elsewhere without behavioral disturbance: Secondary | ICD-10-CM | POA: Diagnosis not present

## 2014-12-31 ENCOUNTER — Ambulatory Visit: Payer: TRICARE For Life (TFL) | Admitting: Family Medicine

## 2015-01-03 DIAGNOSIS — F0280 Dementia in other diseases classified elsewhere without behavioral disturbance: Secondary | ICD-10-CM | POA: Diagnosis not present

## 2015-01-03 DIAGNOSIS — E119 Type 2 diabetes mellitus without complications: Secondary | ICD-10-CM | POA: Diagnosis not present

## 2015-01-03 DIAGNOSIS — G300 Alzheimer's disease with early onset: Secondary | ICD-10-CM | POA: Diagnosis not present

## 2015-01-03 DIAGNOSIS — I129 Hypertensive chronic kidney disease with stage 1 through stage 4 chronic kidney disease, or unspecified chronic kidney disease: Secondary | ICD-10-CM | POA: Diagnosis not present

## 2015-01-03 DIAGNOSIS — I69351 Hemiplegia and hemiparesis following cerebral infarction affecting right dominant side: Secondary | ICD-10-CM | POA: Diagnosis not present

## 2015-01-03 DIAGNOSIS — I69320 Aphasia following cerebral infarction: Secondary | ICD-10-CM | POA: Diagnosis not present

## 2015-01-07 ENCOUNTER — Telehealth: Payer: Self-pay

## 2015-01-07 NOTE — Telephone Encounter (Signed)
Joshua Scott called stated that patient wife called and inquired about a Speech Therapy Evaluation she stated that it was mentioned during patient last office visit but there was no order. Please Advise. Arty Lantzy,CMA

## 2015-01-08 NOTE — Telephone Encounter (Signed)
We can call his home health agency and have them add Speech therapy.

## 2015-01-10 DIAGNOSIS — I69351 Hemiplegia and hemiparesis following cerebral infarction affecting right dominant side: Secondary | ICD-10-CM | POA: Diagnosis not present

## 2015-01-10 DIAGNOSIS — F0280 Dementia in other diseases classified elsewhere without behavioral disturbance: Secondary | ICD-10-CM | POA: Diagnosis not present

## 2015-01-10 DIAGNOSIS — I129 Hypertensive chronic kidney disease with stage 1 through stage 4 chronic kidney disease, or unspecified chronic kidney disease: Secondary | ICD-10-CM | POA: Diagnosis not present

## 2015-01-10 DIAGNOSIS — I69320 Aphasia following cerebral infarction: Secondary | ICD-10-CM | POA: Diagnosis not present

## 2015-01-10 DIAGNOSIS — E119 Type 2 diabetes mellitus without complications: Secondary | ICD-10-CM | POA: Diagnosis not present

## 2015-01-10 DIAGNOSIS — G300 Alzheimer's disease with early onset: Secondary | ICD-10-CM | POA: Diagnosis not present

## 2015-01-10 NOTE — Telephone Encounter (Signed)
Spoke to [place], gave her verbal order. Deaven Urwin,CMA

## 2015-01-12 DIAGNOSIS — I638 Other cerebral infarction: Secondary | ICD-10-CM | POA: Diagnosis not present

## 2015-01-12 DIAGNOSIS — I129 Hypertensive chronic kidney disease with stage 1 through stage 4 chronic kidney disease, or unspecified chronic kidney disease: Secondary | ICD-10-CM | POA: Diagnosis not present

## 2015-01-12 DIAGNOSIS — I639 Cerebral infarction, unspecified: Secondary | ICD-10-CM | POA: Diagnosis not present

## 2015-01-12 DIAGNOSIS — I69320 Aphasia following cerebral infarction: Secondary | ICD-10-CM | POA: Diagnosis not present

## 2015-01-12 DIAGNOSIS — F0280 Dementia in other diseases classified elsewhere without behavioral disturbance: Secondary | ICD-10-CM | POA: Diagnosis not present

## 2015-01-12 DIAGNOSIS — I69351 Hemiplegia and hemiparesis following cerebral infarction affecting right dominant side: Secondary | ICD-10-CM | POA: Diagnosis not present

## 2015-01-12 DIAGNOSIS — G300 Alzheimer's disease with early onset: Secondary | ICD-10-CM | POA: Diagnosis not present

## 2015-01-12 DIAGNOSIS — E119 Type 2 diabetes mellitus without complications: Secondary | ICD-10-CM | POA: Diagnosis not present

## 2015-01-13 DIAGNOSIS — R410 Disorientation, unspecified: Secondary | ICD-10-CM | POA: Diagnosis not present

## 2015-01-13 DIAGNOSIS — Z8673 Personal history of transient ischemic attack (TIA), and cerebral infarction without residual deficits: Secondary | ICD-10-CM | POA: Diagnosis not present

## 2015-01-13 DIAGNOSIS — I10 Essential (primary) hypertension: Secondary | ICD-10-CM | POA: Diagnosis not present

## 2015-01-13 DIAGNOSIS — Z7982 Long term (current) use of aspirin: Secondary | ICD-10-CM | POA: Diagnosis not present

## 2015-01-13 DIAGNOSIS — E1149 Type 2 diabetes mellitus with other diabetic neurological complication: Secondary | ICD-10-CM | POA: Diagnosis not present

## 2015-01-13 DIAGNOSIS — G3184 Mild cognitive impairment, so stated: Secondary | ICD-10-CM | POA: Diagnosis not present

## 2015-01-13 DIAGNOSIS — I129 Hypertensive chronic kidney disease with stage 1 through stage 4 chronic kidney disease, or unspecified chronic kidney disease: Secondary | ICD-10-CM | POA: Diagnosis not present

## 2015-01-13 DIAGNOSIS — F411 Generalized anxiety disorder: Secondary | ICD-10-CM | POA: Diagnosis not present

## 2015-01-13 DIAGNOSIS — Z87891 Personal history of nicotine dependence: Secondary | ICD-10-CM | POA: Diagnosis not present

## 2015-01-13 DIAGNOSIS — E1169 Type 2 diabetes mellitus with other specified complication: Secondary | ICD-10-CM | POA: Diagnosis not present

## 2015-01-13 DIAGNOSIS — E782 Mixed hyperlipidemia: Secondary | ICD-10-CM | POA: Diagnosis not present

## 2015-01-13 DIAGNOSIS — N183 Chronic kidney disease, stage 3 (moderate): Secondary | ICD-10-CM | POA: Diagnosis not present

## 2015-01-13 DIAGNOSIS — Z9889 Other specified postprocedural states: Secondary | ICD-10-CM | POA: Diagnosis not present

## 2015-01-13 DIAGNOSIS — I639 Cerebral infarction, unspecified: Secondary | ICD-10-CM | POA: Diagnosis not present

## 2015-01-13 DIAGNOSIS — I252 Old myocardial infarction: Secondary | ICD-10-CM | POA: Diagnosis not present

## 2015-01-13 DIAGNOSIS — E1122 Type 2 diabetes mellitus with diabetic chronic kidney disease: Secondary | ICD-10-CM | POA: Diagnosis not present

## 2015-01-13 DIAGNOSIS — I2510 Atherosclerotic heart disease of native coronary artery without angina pectoris: Secondary | ICD-10-CM | POA: Diagnosis not present

## 2015-01-13 DIAGNOSIS — R4189 Other symptoms and signs involving cognitive functions and awareness: Secondary | ICD-10-CM | POA: Diagnosis not present

## 2015-01-13 DIAGNOSIS — Z79899 Other long term (current) drug therapy: Secondary | ICD-10-CM | POA: Diagnosis not present

## 2015-01-14 DIAGNOSIS — I69320 Aphasia following cerebral infarction: Secondary | ICD-10-CM | POA: Diagnosis not present

## 2015-01-14 DIAGNOSIS — F0280 Dementia in other diseases classified elsewhere without behavioral disturbance: Secondary | ICD-10-CM | POA: Diagnosis not present

## 2015-01-14 DIAGNOSIS — I129 Hypertensive chronic kidney disease with stage 1 through stage 4 chronic kidney disease, or unspecified chronic kidney disease: Secondary | ICD-10-CM | POA: Diagnosis not present

## 2015-01-14 DIAGNOSIS — I69351 Hemiplegia and hemiparesis following cerebral infarction affecting right dominant side: Secondary | ICD-10-CM | POA: Diagnosis not present

## 2015-01-14 DIAGNOSIS — G300 Alzheimer's disease with early onset: Secondary | ICD-10-CM | POA: Diagnosis not present

## 2015-01-14 DIAGNOSIS — E119 Type 2 diabetes mellitus without complications: Secondary | ICD-10-CM | POA: Diagnosis not present

## 2015-01-18 DIAGNOSIS — G300 Alzheimer's disease with early onset: Secondary | ICD-10-CM | POA: Diagnosis not present

## 2015-01-18 DIAGNOSIS — I129 Hypertensive chronic kidney disease with stage 1 through stage 4 chronic kidney disease, or unspecified chronic kidney disease: Secondary | ICD-10-CM | POA: Diagnosis not present

## 2015-01-18 DIAGNOSIS — F0280 Dementia in other diseases classified elsewhere without behavioral disturbance: Secondary | ICD-10-CM | POA: Diagnosis not present

## 2015-01-18 DIAGNOSIS — I69320 Aphasia following cerebral infarction: Secondary | ICD-10-CM | POA: Diagnosis not present

## 2015-01-18 DIAGNOSIS — E119 Type 2 diabetes mellitus without complications: Secondary | ICD-10-CM | POA: Diagnosis not present

## 2015-01-18 DIAGNOSIS — I69351 Hemiplegia and hemiparesis following cerebral infarction affecting right dominant side: Secondary | ICD-10-CM | POA: Diagnosis not present

## 2015-01-20 DIAGNOSIS — I69320 Aphasia following cerebral infarction: Secondary | ICD-10-CM | POA: Diagnosis not present

## 2015-01-20 DIAGNOSIS — F0280 Dementia in other diseases classified elsewhere without behavioral disturbance: Secondary | ICD-10-CM | POA: Diagnosis not present

## 2015-01-20 DIAGNOSIS — I69351 Hemiplegia and hemiparesis following cerebral infarction affecting right dominant side: Secondary | ICD-10-CM | POA: Diagnosis not present

## 2015-01-20 DIAGNOSIS — R002 Palpitations: Secondary | ICD-10-CM | POA: Diagnosis not present

## 2015-01-20 DIAGNOSIS — E119 Type 2 diabetes mellitus without complications: Secondary | ICD-10-CM | POA: Diagnosis not present

## 2015-01-20 DIAGNOSIS — I129 Hypertensive chronic kidney disease with stage 1 through stage 4 chronic kidney disease, or unspecified chronic kidney disease: Secondary | ICD-10-CM | POA: Diagnosis not present

## 2015-01-20 DIAGNOSIS — G300 Alzheimer's disease with early onset: Secondary | ICD-10-CM | POA: Diagnosis not present

## 2015-01-24 DIAGNOSIS — G300 Alzheimer's disease with early onset: Secondary | ICD-10-CM | POA: Diagnosis not present

## 2015-01-24 DIAGNOSIS — I69320 Aphasia following cerebral infarction: Secondary | ICD-10-CM | POA: Diagnosis not present

## 2015-01-24 DIAGNOSIS — F0280 Dementia in other diseases classified elsewhere without behavioral disturbance: Secondary | ICD-10-CM | POA: Diagnosis not present

## 2015-01-24 DIAGNOSIS — I129 Hypertensive chronic kidney disease with stage 1 through stage 4 chronic kidney disease, or unspecified chronic kidney disease: Secondary | ICD-10-CM | POA: Diagnosis not present

## 2015-01-24 DIAGNOSIS — I69351 Hemiplegia and hemiparesis following cerebral infarction affecting right dominant side: Secondary | ICD-10-CM | POA: Diagnosis not present

## 2015-01-24 DIAGNOSIS — E119 Type 2 diabetes mellitus without complications: Secondary | ICD-10-CM | POA: Diagnosis not present

## 2015-01-25 DIAGNOSIS — F0280 Dementia in other diseases classified elsewhere without behavioral disturbance: Secondary | ICD-10-CM | POA: Diagnosis not present

## 2015-01-25 DIAGNOSIS — I129 Hypertensive chronic kidney disease with stage 1 through stage 4 chronic kidney disease, or unspecified chronic kidney disease: Secondary | ICD-10-CM | POA: Diagnosis not present

## 2015-01-25 DIAGNOSIS — I69320 Aphasia following cerebral infarction: Secondary | ICD-10-CM | POA: Diagnosis not present

## 2015-01-25 DIAGNOSIS — G300 Alzheimer's disease with early onset: Secondary | ICD-10-CM | POA: Diagnosis not present

## 2015-01-25 DIAGNOSIS — I69351 Hemiplegia and hemiparesis following cerebral infarction affecting right dominant side: Secondary | ICD-10-CM | POA: Diagnosis not present

## 2015-01-25 DIAGNOSIS — E119 Type 2 diabetes mellitus without complications: Secondary | ICD-10-CM | POA: Diagnosis not present

## 2015-01-27 DIAGNOSIS — I129 Hypertensive chronic kidney disease with stage 1 through stage 4 chronic kidney disease, or unspecified chronic kidney disease: Secondary | ICD-10-CM | POA: Diagnosis not present

## 2015-01-27 DIAGNOSIS — G300 Alzheimer's disease with early onset: Secondary | ICD-10-CM | POA: Diagnosis not present

## 2015-01-27 DIAGNOSIS — E119 Type 2 diabetes mellitus without complications: Secondary | ICD-10-CM | POA: Diagnosis not present

## 2015-01-27 DIAGNOSIS — I69351 Hemiplegia and hemiparesis following cerebral infarction affecting right dominant side: Secondary | ICD-10-CM | POA: Diagnosis not present

## 2015-01-27 DIAGNOSIS — I69320 Aphasia following cerebral infarction: Secondary | ICD-10-CM | POA: Diagnosis not present

## 2015-01-27 DIAGNOSIS — F0280 Dementia in other diseases classified elsewhere without behavioral disturbance: Secondary | ICD-10-CM | POA: Diagnosis not present

## 2015-01-28 ENCOUNTER — Other Ambulatory Visit: Payer: Self-pay | Admitting: Family Medicine

## 2015-01-31 DIAGNOSIS — F0280 Dementia in other diseases classified elsewhere without behavioral disturbance: Secondary | ICD-10-CM | POA: Diagnosis not present

## 2015-01-31 DIAGNOSIS — I129 Hypertensive chronic kidney disease with stage 1 through stage 4 chronic kidney disease, or unspecified chronic kidney disease: Secondary | ICD-10-CM | POA: Diagnosis not present

## 2015-01-31 DIAGNOSIS — I69351 Hemiplegia and hemiparesis following cerebral infarction affecting right dominant side: Secondary | ICD-10-CM | POA: Diagnosis not present

## 2015-01-31 DIAGNOSIS — E119 Type 2 diabetes mellitus without complications: Secondary | ICD-10-CM | POA: Diagnosis not present

## 2015-01-31 DIAGNOSIS — I69320 Aphasia following cerebral infarction: Secondary | ICD-10-CM | POA: Diagnosis not present

## 2015-01-31 DIAGNOSIS — G300 Alzheimer's disease with early onset: Secondary | ICD-10-CM | POA: Diagnosis not present

## 2015-02-01 DIAGNOSIS — E119 Type 2 diabetes mellitus without complications: Secondary | ICD-10-CM | POA: Diagnosis not present

## 2015-02-01 DIAGNOSIS — F0280 Dementia in other diseases classified elsewhere without behavioral disturbance: Secondary | ICD-10-CM | POA: Diagnosis not present

## 2015-02-01 DIAGNOSIS — I129 Hypertensive chronic kidney disease with stage 1 through stage 4 chronic kidney disease, or unspecified chronic kidney disease: Secondary | ICD-10-CM | POA: Diagnosis not present

## 2015-02-01 DIAGNOSIS — G300 Alzheimer's disease with early onset: Secondary | ICD-10-CM | POA: Diagnosis not present

## 2015-02-01 DIAGNOSIS — I69320 Aphasia following cerebral infarction: Secondary | ICD-10-CM | POA: Diagnosis not present

## 2015-02-01 DIAGNOSIS — I69351 Hemiplegia and hemiparesis following cerebral infarction affecting right dominant side: Secondary | ICD-10-CM | POA: Diagnosis not present

## 2015-02-02 DIAGNOSIS — E119 Type 2 diabetes mellitus without complications: Secondary | ICD-10-CM | POA: Diagnosis not present

## 2015-02-02 DIAGNOSIS — I129 Hypertensive chronic kidney disease with stage 1 through stage 4 chronic kidney disease, or unspecified chronic kidney disease: Secondary | ICD-10-CM | POA: Diagnosis not present

## 2015-02-02 DIAGNOSIS — G300 Alzheimer's disease with early onset: Secondary | ICD-10-CM | POA: Diagnosis not present

## 2015-02-02 DIAGNOSIS — I69351 Hemiplegia and hemiparesis following cerebral infarction affecting right dominant side: Secondary | ICD-10-CM | POA: Diagnosis not present

## 2015-02-02 DIAGNOSIS — F0280 Dementia in other diseases classified elsewhere without behavioral disturbance: Secondary | ICD-10-CM | POA: Diagnosis not present

## 2015-02-02 DIAGNOSIS — I69320 Aphasia following cerebral infarction: Secondary | ICD-10-CM | POA: Diagnosis not present

## 2015-02-03 DIAGNOSIS — F0280 Dementia in other diseases classified elsewhere without behavioral disturbance: Secondary | ICD-10-CM | POA: Diagnosis not present

## 2015-02-03 DIAGNOSIS — I69351 Hemiplegia and hemiparesis following cerebral infarction affecting right dominant side: Secondary | ICD-10-CM | POA: Diagnosis not present

## 2015-02-03 DIAGNOSIS — I69320 Aphasia following cerebral infarction: Secondary | ICD-10-CM | POA: Diagnosis not present

## 2015-02-03 DIAGNOSIS — G300 Alzheimer's disease with early onset: Secondary | ICD-10-CM | POA: Diagnosis not present

## 2015-02-03 DIAGNOSIS — E119 Type 2 diabetes mellitus without complications: Secondary | ICD-10-CM | POA: Diagnosis not present

## 2015-02-03 DIAGNOSIS — I129 Hypertensive chronic kidney disease with stage 1 through stage 4 chronic kidney disease, or unspecified chronic kidney disease: Secondary | ICD-10-CM | POA: Diagnosis not present

## 2015-02-05 DIAGNOSIS — F0280 Dementia in other diseases classified elsewhere without behavioral disturbance: Secondary | ICD-10-CM | POA: Diagnosis not present

## 2015-02-05 DIAGNOSIS — N183 Chronic kidney disease, stage 3 (moderate): Secondary | ICD-10-CM | POA: Diagnosis not present

## 2015-02-05 DIAGNOSIS — I129 Hypertensive chronic kidney disease with stage 1 through stage 4 chronic kidney disease, or unspecified chronic kidney disease: Secondary | ICD-10-CM | POA: Diagnosis not present

## 2015-02-05 DIAGNOSIS — Z7982 Long term (current) use of aspirin: Secondary | ICD-10-CM | POA: Diagnosis not present

## 2015-02-05 DIAGNOSIS — I69320 Aphasia following cerebral infarction: Secondary | ICD-10-CM | POA: Diagnosis not present

## 2015-02-05 DIAGNOSIS — I69351 Hemiplegia and hemiparesis following cerebral infarction affecting right dominant side: Secondary | ICD-10-CM | POA: Diagnosis not present

## 2015-02-05 DIAGNOSIS — E1122 Type 2 diabetes mellitus with diabetic chronic kidney disease: Secondary | ICD-10-CM | POA: Diagnosis not present

## 2015-02-05 DIAGNOSIS — G300 Alzheimer's disease with early onset: Secondary | ICD-10-CM | POA: Diagnosis not present

## 2015-02-05 DIAGNOSIS — Z7984 Long term (current) use of oral hypoglycemic drugs: Secondary | ICD-10-CM | POA: Diagnosis not present

## 2015-02-08 ENCOUNTER — Other Ambulatory Visit: Payer: Self-pay | Admitting: Family Medicine

## 2015-02-08 DIAGNOSIS — I129 Hypertensive chronic kidney disease with stage 1 through stage 4 chronic kidney disease, or unspecified chronic kidney disease: Secondary | ICD-10-CM | POA: Diagnosis not present

## 2015-02-08 DIAGNOSIS — N183 Chronic kidney disease, stage 3 (moderate): Secondary | ICD-10-CM | POA: Diagnosis not present

## 2015-02-08 DIAGNOSIS — I69320 Aphasia following cerebral infarction: Secondary | ICD-10-CM | POA: Diagnosis not present

## 2015-02-08 DIAGNOSIS — G300 Alzheimer's disease with early onset: Secondary | ICD-10-CM | POA: Diagnosis not present

## 2015-02-08 DIAGNOSIS — I69351 Hemiplegia and hemiparesis following cerebral infarction affecting right dominant side: Secondary | ICD-10-CM | POA: Diagnosis not present

## 2015-02-08 DIAGNOSIS — E1122 Type 2 diabetes mellitus with diabetic chronic kidney disease: Secondary | ICD-10-CM | POA: Diagnosis not present

## 2015-02-09 DIAGNOSIS — F0391 Unspecified dementia with behavioral disturbance: Secondary | ICD-10-CM | POA: Diagnosis not present

## 2015-02-09 DIAGNOSIS — E119 Type 2 diabetes mellitus without complications: Secondary | ICD-10-CM | POA: Diagnosis not present

## 2015-02-09 DIAGNOSIS — Z87891 Personal history of nicotine dependence: Secondary | ICD-10-CM | POA: Diagnosis not present

## 2015-02-09 DIAGNOSIS — R4189 Other symptoms and signs involving cognitive functions and awareness: Secondary | ICD-10-CM | POA: Diagnosis not present

## 2015-02-09 DIAGNOSIS — I6930 Unspecified sequelae of cerebral infarction: Secondary | ICD-10-CM | POA: Diagnosis not present

## 2015-02-09 DIAGNOSIS — R451 Restlessness and agitation: Secondary | ICD-10-CM | POA: Diagnosis not present

## 2015-02-09 DIAGNOSIS — Z8673 Personal history of transient ischemic attack (TIA), and cerebral infarction without residual deficits: Secondary | ICD-10-CM | POA: Diagnosis not present

## 2015-02-09 DIAGNOSIS — F419 Anxiety disorder, unspecified: Secondary | ICD-10-CM | POA: Diagnosis not present

## 2015-02-11 DIAGNOSIS — I69320 Aphasia following cerebral infarction: Secondary | ICD-10-CM | POA: Diagnosis not present

## 2015-02-11 DIAGNOSIS — I69351 Hemiplegia and hemiparesis following cerebral infarction affecting right dominant side: Secondary | ICD-10-CM | POA: Diagnosis not present

## 2015-02-11 DIAGNOSIS — E1122 Type 2 diabetes mellitus with diabetic chronic kidney disease: Secondary | ICD-10-CM | POA: Diagnosis not present

## 2015-02-11 DIAGNOSIS — G300 Alzheimer's disease with early onset: Secondary | ICD-10-CM | POA: Diagnosis not present

## 2015-02-11 DIAGNOSIS — I129 Hypertensive chronic kidney disease with stage 1 through stage 4 chronic kidney disease, or unspecified chronic kidney disease: Secondary | ICD-10-CM | POA: Diagnosis not present

## 2015-02-11 DIAGNOSIS — N183 Chronic kidney disease, stage 3 (moderate): Secondary | ICD-10-CM | POA: Diagnosis not present

## 2015-02-14 DIAGNOSIS — I129 Hypertensive chronic kidney disease with stage 1 through stage 4 chronic kidney disease, or unspecified chronic kidney disease: Secondary | ICD-10-CM | POA: Diagnosis not present

## 2015-02-14 DIAGNOSIS — I69320 Aphasia following cerebral infarction: Secondary | ICD-10-CM | POA: Diagnosis not present

## 2015-02-14 DIAGNOSIS — E1122 Type 2 diabetes mellitus with diabetic chronic kidney disease: Secondary | ICD-10-CM | POA: Diagnosis not present

## 2015-02-14 DIAGNOSIS — G300 Alzheimer's disease with early onset: Secondary | ICD-10-CM | POA: Diagnosis not present

## 2015-02-14 DIAGNOSIS — I69351 Hemiplegia and hemiparesis following cerebral infarction affecting right dominant side: Secondary | ICD-10-CM | POA: Diagnosis not present

## 2015-02-14 DIAGNOSIS — N183 Chronic kidney disease, stage 3 (moderate): Secondary | ICD-10-CM | POA: Diagnosis not present

## 2015-02-16 DIAGNOSIS — I129 Hypertensive chronic kidney disease with stage 1 through stage 4 chronic kidney disease, or unspecified chronic kidney disease: Secondary | ICD-10-CM | POA: Diagnosis not present

## 2015-02-16 DIAGNOSIS — N183 Chronic kidney disease, stage 3 (moderate): Secondary | ICD-10-CM | POA: Diagnosis not present

## 2015-02-16 DIAGNOSIS — I69351 Hemiplegia and hemiparesis following cerebral infarction affecting right dominant side: Secondary | ICD-10-CM | POA: Diagnosis not present

## 2015-02-16 DIAGNOSIS — I69320 Aphasia following cerebral infarction: Secondary | ICD-10-CM | POA: Diagnosis not present

## 2015-02-16 DIAGNOSIS — G300 Alzheimer's disease with early onset: Secondary | ICD-10-CM | POA: Diagnosis not present

## 2015-02-16 DIAGNOSIS — E1122 Type 2 diabetes mellitus with diabetic chronic kidney disease: Secondary | ICD-10-CM | POA: Diagnosis not present

## 2015-02-18 DIAGNOSIS — G300 Alzheimer's disease with early onset: Secondary | ICD-10-CM | POA: Diagnosis not present

## 2015-02-18 DIAGNOSIS — I129 Hypertensive chronic kidney disease with stage 1 through stage 4 chronic kidney disease, or unspecified chronic kidney disease: Secondary | ICD-10-CM | POA: Diagnosis not present

## 2015-02-18 DIAGNOSIS — I69351 Hemiplegia and hemiparesis following cerebral infarction affecting right dominant side: Secondary | ICD-10-CM | POA: Diagnosis not present

## 2015-02-18 DIAGNOSIS — E1122 Type 2 diabetes mellitus with diabetic chronic kidney disease: Secondary | ICD-10-CM | POA: Diagnosis not present

## 2015-02-18 DIAGNOSIS — N183 Chronic kidney disease, stage 3 (moderate): Secondary | ICD-10-CM | POA: Diagnosis not present

## 2015-02-18 DIAGNOSIS — I69320 Aphasia following cerebral infarction: Secondary | ICD-10-CM | POA: Diagnosis not present

## 2015-02-22 DIAGNOSIS — I69351 Hemiplegia and hemiparesis following cerebral infarction affecting right dominant side: Secondary | ICD-10-CM | POA: Diagnosis not present

## 2015-02-22 DIAGNOSIS — G300 Alzheimer's disease with early onset: Secondary | ICD-10-CM | POA: Diagnosis not present

## 2015-02-22 DIAGNOSIS — E1122 Type 2 diabetes mellitus with diabetic chronic kidney disease: Secondary | ICD-10-CM | POA: Diagnosis not present

## 2015-02-22 DIAGNOSIS — I69320 Aphasia following cerebral infarction: Secondary | ICD-10-CM | POA: Diagnosis not present

## 2015-02-22 DIAGNOSIS — N183 Chronic kidney disease, stage 3 (moderate): Secondary | ICD-10-CM | POA: Diagnosis not present

## 2015-02-22 DIAGNOSIS — I129 Hypertensive chronic kidney disease with stage 1 through stage 4 chronic kidney disease, or unspecified chronic kidney disease: Secondary | ICD-10-CM | POA: Diagnosis not present

## 2015-02-23 DIAGNOSIS — G300 Alzheimer's disease with early onset: Secondary | ICD-10-CM | POA: Diagnosis not present

## 2015-02-23 DIAGNOSIS — I129 Hypertensive chronic kidney disease with stage 1 through stage 4 chronic kidney disease, or unspecified chronic kidney disease: Secondary | ICD-10-CM | POA: Diagnosis not present

## 2015-02-23 DIAGNOSIS — E1122 Type 2 diabetes mellitus with diabetic chronic kidney disease: Secondary | ICD-10-CM | POA: Diagnosis not present

## 2015-02-23 DIAGNOSIS — I69320 Aphasia following cerebral infarction: Secondary | ICD-10-CM | POA: Diagnosis not present

## 2015-02-23 DIAGNOSIS — N183 Chronic kidney disease, stage 3 (moderate): Secondary | ICD-10-CM | POA: Diagnosis not present

## 2015-02-23 DIAGNOSIS — I69351 Hemiplegia and hemiparesis following cerebral infarction affecting right dominant side: Secondary | ICD-10-CM | POA: Diagnosis not present

## 2015-02-24 DIAGNOSIS — I69351 Hemiplegia and hemiparesis following cerebral infarction affecting right dominant side: Secondary | ICD-10-CM | POA: Diagnosis not present

## 2015-02-24 DIAGNOSIS — I69320 Aphasia following cerebral infarction: Secondary | ICD-10-CM | POA: Diagnosis not present

## 2015-02-24 DIAGNOSIS — N183 Chronic kidney disease, stage 3 (moderate): Secondary | ICD-10-CM | POA: Diagnosis not present

## 2015-02-24 DIAGNOSIS — E1122 Type 2 diabetes mellitus with diabetic chronic kidney disease: Secondary | ICD-10-CM | POA: Diagnosis not present

## 2015-02-24 DIAGNOSIS — I129 Hypertensive chronic kidney disease with stage 1 through stage 4 chronic kidney disease, or unspecified chronic kidney disease: Secondary | ICD-10-CM | POA: Diagnosis not present

## 2015-02-24 DIAGNOSIS — G300 Alzheimer's disease with early onset: Secondary | ICD-10-CM | POA: Diagnosis not present

## 2015-02-25 DIAGNOSIS — I10 Essential (primary) hypertension: Secondary | ICD-10-CM | POA: Diagnosis not present

## 2015-02-25 DIAGNOSIS — E785 Hyperlipidemia, unspecified: Secondary | ICD-10-CM | POA: Diagnosis not present

## 2015-02-25 DIAGNOSIS — E119 Type 2 diabetes mellitus without complications: Secondary | ICD-10-CM | POA: Diagnosis not present

## 2015-02-25 DIAGNOSIS — N183 Chronic kidney disease, stage 3 (moderate): Secondary | ICD-10-CM | POA: Diagnosis not present

## 2015-02-25 DIAGNOSIS — E669 Obesity, unspecified: Secondary | ICD-10-CM | POA: Diagnosis not present

## 2015-03-01 DIAGNOSIS — G300 Alzheimer's disease with early onset: Secondary | ICD-10-CM | POA: Diagnosis not present

## 2015-03-01 DIAGNOSIS — E1122 Type 2 diabetes mellitus with diabetic chronic kidney disease: Secondary | ICD-10-CM | POA: Diagnosis not present

## 2015-03-01 DIAGNOSIS — I129 Hypertensive chronic kidney disease with stage 1 through stage 4 chronic kidney disease, or unspecified chronic kidney disease: Secondary | ICD-10-CM | POA: Diagnosis not present

## 2015-03-01 DIAGNOSIS — I69320 Aphasia following cerebral infarction: Secondary | ICD-10-CM | POA: Diagnosis not present

## 2015-03-01 DIAGNOSIS — N183 Chronic kidney disease, stage 3 (moderate): Secondary | ICD-10-CM | POA: Diagnosis not present

## 2015-03-01 DIAGNOSIS — I69351 Hemiplegia and hemiparesis following cerebral infarction affecting right dominant side: Secondary | ICD-10-CM | POA: Diagnosis not present

## 2015-03-01 NOTE — Progress Notes (Signed)
      HPI: FU CAD. Nuclear study in April of 2015 showed an ejection fraction of 57%. There was an inferior lateral defect concerning for prior infarction with mild peri-infarct ischemia. Cardiac catheterization in June 2015 showed mild irregularities in the left main, 30% LAD, 60-70% proximal circumflex, mid 50% and a 95-99% first obtuse marginal. There was a 70-80% right coronary artery. Medical therapy recommended. Since last seen,   Current Outpatient Prescriptions  Medication Sig Dispense Refill  . allopurinol (ZYLOPRIM) 300 MG tablet TAKE 1 TABLET DAILY 90 tablet 0  . aspirin 325 MG tablet Take 325 mg by mouth daily.    Marland Kitchen atorvastatin (LIPITOR) 80 MG tablet Take 1 tablet (80 mg total) by mouth daily. 30 tablet 0  . doxazosin (CARDURA) 4 MG tablet TAKE 1 TABLET AT BEDTIME 90 tablet 1  . glipiZIDE (GLIPIZIDE XL) 5 MG 24 hr tablet Take 1 tablet (5 mg total) by mouth daily. Patient needs to schedule a follow up appointment before more refills. 90 tablet 0  . TOPROL XL 25 MG 24 hr tablet TAKE 1 TABLET DAILY 90 tablet 3  . traZODone (DESYREL) 50 MG tablet Take 0.5 tablets (25 mg total) by mouth at bedtime. 30 MINUTES BEFORE BED 30 tablet 0  . valsartan (DIOVAN) 320 MG tablet TAKE 1 TABLET DAILY 90 tablet 1  . vitamin B-12 (CYANOCOBALAMIN) 1000 MCG tablet Take 1,000 mcg by mouth.     No current facility-administered medications for this visit.     Past Medical History  Diagnosis Date  . Hyperlipidemia   . Diabetes mellitus   . Hypertension   . Gout   . ED (erectile dysfunction)   . CAD (coronary artery disease)     Past Surgical History  Procedure Laterality Date  . Cataract extraction w/ intraocular lens implant  06/14/11    Left eye  . Cataract extraction w/ intraocular lens implant  07/03/11    Right eye  . Left heart catheterization with coronary angiogram N/A 07/14/2013    Procedure: LEFT HEART CATHETERIZATION WITH CORONARY ANGIOGRAM;  Surgeon: Peter M Martinique, MD;  Location:  [facility] CATH LAB;  Service: Cardiovascular;  Laterality: N/A;    Social History   Social History  . Marital Status: Married    Spouse Name: N/A  . Number of Children: 3  . Years of Education: N/A   Occupational History  .      Retired from [employer]  . Smoking status: Former Smoker    Quit date: 01/23/1984  . Smokeless tobacco: Not on file  . Alcohol Use: No  . Drug Use: No  . Sexual Activity: Not on file   Other Topics Concern  . Not on file   Social History Narrative    Family History  Problem Relation Age of Onset  . Diabetes Mother     ROS: no fevers or chills, productive cough, hemoptysis, dysphasia, odynophagia, melena, hematochezia, dysuria, hematuria, rash, seizure activity, orthopnea, PND, pedal edema, claudication. Remaining systems are negative.  Physical Exam: Well-developed well-nourished in no acute distress.  Skin is warm and dry.  HEENT is normal.  Neck is supple.  Chest is clear to auscultation with normal expansion.  Cardiovascular exam is regular rate and rhythm.  Abdominal exam nontender or distended. No masses palpated. Extremities show no edema. neuro grossly intact  ECG     This encounter was created in error - please disregard.

## 2015-03-02 DIAGNOSIS — G300 Alzheimer's disease with early onset: Secondary | ICD-10-CM | POA: Diagnosis not present

## 2015-03-02 DIAGNOSIS — N183 Chronic kidney disease, stage 3 (moderate): Secondary | ICD-10-CM | POA: Diagnosis not present

## 2015-03-02 DIAGNOSIS — E1122 Type 2 diabetes mellitus with diabetic chronic kidney disease: Secondary | ICD-10-CM | POA: Diagnosis not present

## 2015-03-02 DIAGNOSIS — I129 Hypertensive chronic kidney disease with stage 1 through stage 4 chronic kidney disease, or unspecified chronic kidney disease: Secondary | ICD-10-CM | POA: Diagnosis not present

## 2015-03-02 DIAGNOSIS — I69320 Aphasia following cerebral infarction: Secondary | ICD-10-CM | POA: Diagnosis not present

## 2015-03-02 DIAGNOSIS — I69351 Hemiplegia and hemiparesis following cerebral infarction affecting right dominant side: Secondary | ICD-10-CM | POA: Diagnosis not present

## 2015-03-03 DIAGNOSIS — I69320 Aphasia following cerebral infarction: Secondary | ICD-10-CM | POA: Diagnosis not present

## 2015-03-03 DIAGNOSIS — N183 Chronic kidney disease, stage 3 (moderate): Secondary | ICD-10-CM | POA: Diagnosis not present

## 2015-03-03 DIAGNOSIS — I69351 Hemiplegia and hemiparesis following cerebral infarction affecting right dominant side: Secondary | ICD-10-CM | POA: Diagnosis not present

## 2015-03-03 DIAGNOSIS — E1122 Type 2 diabetes mellitus with diabetic chronic kidney disease: Secondary | ICD-10-CM | POA: Diagnosis not present

## 2015-03-03 DIAGNOSIS — G300 Alzheimer's disease with early onset: Secondary | ICD-10-CM | POA: Diagnosis not present

## 2015-03-03 DIAGNOSIS — I129 Hypertensive chronic kidney disease with stage 1 through stage 4 chronic kidney disease, or unspecified chronic kidney disease: Secondary | ICD-10-CM | POA: Diagnosis not present

## 2015-03-07 ENCOUNTER — Encounter: Payer: TRICARE For Life (TFL) | Admitting: Cardiology

## 2015-03-09 DIAGNOSIS — E7800 Pure hypercholesterolemia, unspecified: Secondary | ICD-10-CM | POA: Diagnosis not present

## 2015-03-09 DIAGNOSIS — Z87891 Personal history of nicotine dependence: Secondary | ICD-10-CM | POA: Diagnosis not present

## 2015-03-09 DIAGNOSIS — J321 Chronic frontal sinusitis: Secondary | ICD-10-CM | POA: Diagnosis not present

## 2015-03-09 DIAGNOSIS — E1149 Type 2 diabetes mellitus with other diabetic neurological complication: Secondary | ICD-10-CM | POA: Diagnosis not present

## 2015-03-09 DIAGNOSIS — M6281 Muscle weakness (generalized): Secondary | ICD-10-CM | POA: Diagnosis not present

## 2015-03-09 DIAGNOSIS — R531 Weakness: Secondary | ICD-10-CM | POA: Diagnosis not present

## 2015-03-09 DIAGNOSIS — E1122 Type 2 diabetes mellitus with diabetic chronic kidney disease: Secondary | ICD-10-CM | POA: Diagnosis not present

## 2015-03-09 DIAGNOSIS — R414 Neurologic neglect syndrome: Secondary | ICD-10-CM | POA: Diagnosis not present

## 2015-03-09 DIAGNOSIS — I6782 Cerebral ischemia: Secondary | ICD-10-CM | POA: Diagnosis not present

## 2015-03-09 DIAGNOSIS — Z8673 Personal history of transient ischemic attack (TIA), and cerebral infarction without residual deficits: Secondary | ICD-10-CM | POA: Diagnosis not present

## 2015-03-09 DIAGNOSIS — K5900 Constipation, unspecified: Secondary | ICD-10-CM | POA: Diagnosis not present

## 2015-03-09 DIAGNOSIS — W19XXXA Unspecified fall, initial encounter: Secondary | ICD-10-CM | POA: Diagnosis not present

## 2015-03-09 DIAGNOSIS — G3184 Mild cognitive impairment, so stated: Secondary | ICD-10-CM | POA: Diagnosis not present

## 2015-03-09 DIAGNOSIS — I639 Cerebral infarction, unspecified: Secondary | ICD-10-CM | POA: Diagnosis not present

## 2015-03-09 DIAGNOSIS — I10 Essential (primary) hypertension: Secondary | ICD-10-CM | POA: Diagnosis not present

## 2015-03-09 DIAGNOSIS — E782 Mixed hyperlipidemia: Secondary | ICD-10-CM | POA: Diagnosis not present

## 2015-03-09 DIAGNOSIS — Z043 Encounter for examination and observation following other accident: Secondary | ICD-10-CM | POA: Diagnosis not present

## 2015-03-09 DIAGNOSIS — R404 Transient alteration of awareness: Secondary | ICD-10-CM | POA: Diagnosis not present

## 2015-03-09 DIAGNOSIS — Z7409 Other reduced mobility: Secondary | ICD-10-CM | POA: Diagnosis not present

## 2015-03-09 DIAGNOSIS — J320 Chronic maxillary sinusitis: Secondary | ICD-10-CM | POA: Diagnosis not present

## 2015-03-09 DIAGNOSIS — N183 Chronic kidney disease, stage 3 (moderate): Secondary | ICD-10-CM | POA: Diagnosis not present

## 2015-03-09 DIAGNOSIS — M109 Gout, unspecified: Secondary | ICD-10-CM | POA: Diagnosis not present

## 2015-03-09 DIAGNOSIS — I129 Hypertensive chronic kidney disease with stage 1 through stage 4 chronic kidney disease, or unspecified chronic kidney disease: Secondary | ICD-10-CM | POA: Diagnosis not present

## 2015-03-09 DIAGNOSIS — J322 Chronic ethmoidal sinusitis: Secondary | ICD-10-CM | POA: Diagnosis not present

## 2015-03-10 DIAGNOSIS — I69351 Hemiplegia and hemiparesis following cerebral infarction affecting right dominant side: Secondary | ICD-10-CM | POA: Diagnosis not present

## 2015-03-10 DIAGNOSIS — I69320 Aphasia following cerebral infarction: Secondary | ICD-10-CM | POA: Diagnosis not present

## 2015-03-10 DIAGNOSIS — E1122 Type 2 diabetes mellitus with diabetic chronic kidney disease: Secondary | ICD-10-CM | POA: Diagnosis not present

## 2015-03-10 DIAGNOSIS — N183 Chronic kidney disease, stage 3 (moderate): Secondary | ICD-10-CM | POA: Diagnosis not present

## 2015-03-10 DIAGNOSIS — I129 Hypertensive chronic kidney disease with stage 1 through stage 4 chronic kidney disease, or unspecified chronic kidney disease: Secondary | ICD-10-CM | POA: Diagnosis not present

## 2015-03-10 DIAGNOSIS — G300 Alzheimer's disease with early onset: Secondary | ICD-10-CM | POA: Diagnosis not present

## 2015-03-11 DIAGNOSIS — I129 Hypertensive chronic kidney disease with stage 1 through stage 4 chronic kidney disease, or unspecified chronic kidney disease: Secondary | ICD-10-CM | POA: Diagnosis not present

## 2015-03-11 DIAGNOSIS — E1122 Type 2 diabetes mellitus with diabetic chronic kidney disease: Secondary | ICD-10-CM | POA: Diagnosis not present

## 2015-03-11 DIAGNOSIS — I69320 Aphasia following cerebral infarction: Secondary | ICD-10-CM | POA: Diagnosis not present

## 2015-03-11 DIAGNOSIS — G300 Alzheimer's disease with early onset: Secondary | ICD-10-CM | POA: Diagnosis not present

## 2015-03-11 DIAGNOSIS — I69351 Hemiplegia and hemiparesis following cerebral infarction affecting right dominant side: Secondary | ICD-10-CM | POA: Diagnosis not present

## 2015-03-11 DIAGNOSIS — N183 Chronic kidney disease, stage 3 (moderate): Secondary | ICD-10-CM | POA: Diagnosis not present

## 2015-03-14 DIAGNOSIS — G300 Alzheimer's disease with early onset: Secondary | ICD-10-CM | POA: Diagnosis not present

## 2015-03-14 DIAGNOSIS — N183 Chronic kidney disease, stage 3 (moderate): Secondary | ICD-10-CM | POA: Diagnosis not present

## 2015-03-14 DIAGNOSIS — I69320 Aphasia following cerebral infarction: Secondary | ICD-10-CM | POA: Diagnosis not present

## 2015-03-14 DIAGNOSIS — I69351 Hemiplegia and hemiparesis following cerebral infarction affecting right dominant side: Secondary | ICD-10-CM | POA: Diagnosis not present

## 2015-03-14 DIAGNOSIS — E1122 Type 2 diabetes mellitus with diabetic chronic kidney disease: Secondary | ICD-10-CM | POA: Diagnosis not present

## 2015-03-14 DIAGNOSIS — I129 Hypertensive chronic kidney disease with stage 1 through stage 4 chronic kidney disease, or unspecified chronic kidney disease: Secondary | ICD-10-CM | POA: Diagnosis not present

## 2015-03-17 ENCOUNTER — Other Ambulatory Visit: Payer: Self-pay | Admitting: Family Medicine

## 2015-03-17 DIAGNOSIS — I129 Hypertensive chronic kidney disease with stage 1 through stage 4 chronic kidney disease, or unspecified chronic kidney disease: Secondary | ICD-10-CM | POA: Diagnosis not present

## 2015-03-17 DIAGNOSIS — E1122 Type 2 diabetes mellitus with diabetic chronic kidney disease: Secondary | ICD-10-CM | POA: Diagnosis not present

## 2015-03-17 DIAGNOSIS — I69320 Aphasia following cerebral infarction: Secondary | ICD-10-CM | POA: Diagnosis not present

## 2015-03-17 DIAGNOSIS — I69351 Hemiplegia and hemiparesis following cerebral infarction affecting right dominant side: Secondary | ICD-10-CM | POA: Diagnosis not present

## 2015-03-17 DIAGNOSIS — G300 Alzheimer's disease with early onset: Secondary | ICD-10-CM | POA: Diagnosis not present

## 2015-03-17 DIAGNOSIS — N183 Chronic kidney disease, stage 3 (moderate): Secondary | ICD-10-CM | POA: Diagnosis not present

## 2015-03-23 DIAGNOSIS — G4701 Insomnia due to medical condition: Secondary | ICD-10-CM | POA: Diagnosis not present

## 2015-03-23 DIAGNOSIS — I639 Cerebral infarction, unspecified: Secondary | ICD-10-CM | POA: Diagnosis not present

## 2015-03-23 DIAGNOSIS — I69351 Hemiplegia and hemiparesis following cerebral infarction affecting right dominant side: Secondary | ICD-10-CM | POA: Diagnosis not present

## 2015-03-23 DIAGNOSIS — F411 Generalized anxiety disorder: Secondary | ICD-10-CM | POA: Diagnosis not present

## 2015-03-23 DIAGNOSIS — N183 Chronic kidney disease, stage 3 (moderate): Secondary | ICD-10-CM | POA: Diagnosis not present

## 2015-03-23 DIAGNOSIS — G3184 Mild cognitive impairment, so stated: Secondary | ICD-10-CM | POA: Diagnosis not present

## 2015-03-23 DIAGNOSIS — I10 Essential (primary) hypertension: Secondary | ICD-10-CM | POA: Diagnosis not present

## 2015-03-23 DIAGNOSIS — E1122 Type 2 diabetes mellitus with diabetic chronic kidney disease: Secondary | ICD-10-CM | POA: Diagnosis not present

## 2015-03-23 DIAGNOSIS — E1149 Type 2 diabetes mellitus with other diabetic neurological complication: Secondary | ICD-10-CM | POA: Diagnosis not present

## 2015-03-23 DIAGNOSIS — G300 Alzheimer's disease with early onset: Secondary | ICD-10-CM | POA: Diagnosis not present

## 2015-03-23 DIAGNOSIS — I69320 Aphasia following cerebral infarction: Secondary | ICD-10-CM | POA: Diagnosis not present

## 2015-03-23 DIAGNOSIS — N401 Enlarged prostate with lower urinary tract symptoms: Secondary | ICD-10-CM | POA: Diagnosis not present

## 2015-03-23 DIAGNOSIS — I129 Hypertensive chronic kidney disease with stage 1 through stage 4 chronic kidney disease, or unspecified chronic kidney disease: Secondary | ICD-10-CM | POA: Diagnosis not present

## 2015-03-25 DIAGNOSIS — I129 Hypertensive chronic kidney disease with stage 1 through stage 4 chronic kidney disease, or unspecified chronic kidney disease: Secondary | ICD-10-CM | POA: Diagnosis not present

## 2015-03-25 DIAGNOSIS — E1122 Type 2 diabetes mellitus with diabetic chronic kidney disease: Secondary | ICD-10-CM | POA: Diagnosis not present

## 2015-03-25 DIAGNOSIS — I69351 Hemiplegia and hemiparesis following cerebral infarction affecting right dominant side: Secondary | ICD-10-CM | POA: Diagnosis not present

## 2015-03-25 DIAGNOSIS — I69320 Aphasia following cerebral infarction: Secondary | ICD-10-CM | POA: Diagnosis not present

## 2015-03-25 DIAGNOSIS — G300 Alzheimer's disease with early onset: Secondary | ICD-10-CM | POA: Diagnosis not present

## 2015-03-25 DIAGNOSIS — N183 Chronic kidney disease, stage 3 (moderate): Secondary | ICD-10-CM | POA: Diagnosis not present

## 2015-03-29 DIAGNOSIS — I129 Hypertensive chronic kidney disease with stage 1 through stage 4 chronic kidney disease, or unspecified chronic kidney disease: Secondary | ICD-10-CM | POA: Diagnosis not present

## 2015-03-29 DIAGNOSIS — N183 Chronic kidney disease, stage 3 (moderate): Secondary | ICD-10-CM | POA: Diagnosis not present

## 2015-03-29 DIAGNOSIS — I69320 Aphasia following cerebral infarction: Secondary | ICD-10-CM | POA: Diagnosis not present

## 2015-03-29 DIAGNOSIS — I69351 Hemiplegia and hemiparesis following cerebral infarction affecting right dominant side: Secondary | ICD-10-CM | POA: Diagnosis not present

## 2015-03-29 DIAGNOSIS — E1122 Type 2 diabetes mellitus with diabetic chronic kidney disease: Secondary | ICD-10-CM | POA: Diagnosis not present

## 2015-03-29 DIAGNOSIS — G300 Alzheimer's disease with early onset: Secondary | ICD-10-CM | POA: Diagnosis not present

## 2015-03-31 DIAGNOSIS — G300 Alzheimer's disease with early onset: Secondary | ICD-10-CM | POA: Diagnosis not present

## 2015-03-31 DIAGNOSIS — N183 Chronic kidney disease, stage 3 (moderate): Secondary | ICD-10-CM | POA: Diagnosis not present

## 2015-03-31 DIAGNOSIS — I69320 Aphasia following cerebral infarction: Secondary | ICD-10-CM | POA: Diagnosis not present

## 2015-03-31 DIAGNOSIS — E1122 Type 2 diabetes mellitus with diabetic chronic kidney disease: Secondary | ICD-10-CM | POA: Diagnosis not present

## 2015-03-31 DIAGNOSIS — I69351 Hemiplegia and hemiparesis following cerebral infarction affecting right dominant side: Secondary | ICD-10-CM | POA: Diagnosis not present

## 2015-03-31 DIAGNOSIS — I129 Hypertensive chronic kidney disease with stage 1 through stage 4 chronic kidney disease, or unspecified chronic kidney disease: Secondary | ICD-10-CM | POA: Diagnosis not present

## 2015-04-01 ENCOUNTER — Encounter: Payer: Self-pay | Admitting: *Deleted

## 2015-04-20 NOTE — Progress Notes (Signed)
HPI: FU CAD. Nuclear study in April of 2015 showed an ejection fraction of 57%. There was an inferior lateral defect concerning for prior infarction with mild peri-infarct ischemia. Cardiac catheterization in June 2015 showed mild irregularities in the left main, 30% LAD, 60-70% proximal circumflex, mid 50% and a 95-99% first obtuse marginal. There was a 70-80% right coronary artery. Medical therapy recommended. Had CVA November 2016. Admitted in [place]. Echocardiogram showed normal LV function, mildly dilated aortic root, moderately dilated sinus of Valsalva and PFO. Carotid Doppler showed no obstruction. Since last seen, He has had problems with confusion but apparently that is improving recently. He denies dyspnea, chest pain, palpitations or syncope.  Current Outpatient Prescriptions  Medication Sig Dispense Refill  . allopurinol (ZYLOPRIM) 300 MG tablet TAKE 1 TABLET DAILY 90 tablet 0  . aspirin 325 MG tablet Take 325 mg by mouth daily.    Marland Kitchen atorvastatin (LIPITOR) 80 MG tablet Take 1 tablet (80 mg total) by mouth daily. 30 tablet 0  . donepezil (ARICEPT) 10 MG tablet Take 10 mg by mouth at bedtime.    Marland Kitchen doxazosin (CARDURA) 4 MG tablet Take 1 tablet (4 mg total) by mouth at bedtime. NEED FOLLOW UP APPOINTMENT FOR MORE REFILLS 30 tablet 0  . glipiZIDE (GLIPIZIDE XL) 5 MG 24 hr tablet Take 1 tablet (5 mg total) by mouth daily. Patient needs to schedule a follow up appointment before more refills. 90 tablet 0  . hydrALAZINE (APRESOLINE) 50 MG tablet Take 50 mg by mouth 3 (three) times daily.    . sertraline (ZOLOFT) 100 MG tablet Take 1 tablet by mouth daily. Take 1 tab by mouth daily    . TOPROL XL 25 MG 24 hr tablet TAKE 1 TABLET DAILY 90 tablet 3  . traZODone (DESYREL) 50 MG tablet Take 0.5 tablets (25 mg total) by mouth at bedtime. 30 MINUTES BEFORE BED 30 tablet 0  . valsartan (DIOVAN) 320 MG tablet TAKE 1 TABLET DAILY 90 tablet 1  . vitamin B-12 (CYANOCOBALAMIN) 1000 MCG tablet  Take 1,000 mcg by mouth.     No current facility-administered medications for this visit.     Past Medical History  Diagnosis Date  . Hyperlipidemia   . Diabetes mellitus   . Hypertension   . Gout   . ED (erectile dysfunction)   . CAD (coronary artery disease)     Past Surgical History  Procedure Laterality Date  . Cataract extraction w/ intraocular lens implant  06/14/11    Left eye  . Cataract extraction w/ intraocular lens implant  07/03/11    Right eye  . Left heart catheterization with coronary angiogram N/A 07/14/2013    Procedure: LEFT HEART CATHETERIZATION WITH CORONARY ANGIOGRAM;  Surgeon: Peter M Martinique, MD;  Location: [facility] CATH LAB;  Service: Cardiovascular;  Laterality: N/A;    Social History   Social History  . Marital Status: Married    Spouse Name: N/A  . Number of Children: 3  . Years of Education: N/A   Occupational History  .      Retired from [employer]  . Smoking status: Former Smoker    Quit date: 01/23/1984  . Smokeless tobacco: Not on file  . Alcohol Use: No  . Drug Use: No  . Sexual Activity: Not on file   Other Topics Concern  . Not on file   Social History Narrative    Family History  Problem Relation Age of Onset  .  Diabetes Mother     ROS: no fevers or chills, productive cough, hemoptysis, dysphasia, odynophagia, melena, hematochezia, dysuria, hematuria, rash, seizure activity, orthopnea, PND, pedal edema, claudication. Remaining systems are negative.  Physical Exam: Well-developed well-nourished in no acute distress.  Skin is warm and dry.  HEENT is normal.  Neck is supple.  Chest is clear to auscultation with normal expansion.  Cardiovascular exam is regular rate and rhythm.  Abdominal exam nontender or distended. No masses palpated. Extremities show no edema. neuro grossly intact  ECG Sinus rhythm at a rate of 86. First-degree AV block. No ST changes. Inferior infarct.

## 2015-04-25 ENCOUNTER — Ambulatory Visit (INDEPENDENT_AMBULATORY_CARE_PROVIDER_SITE_OTHER): Payer: Medicare Other | Admitting: Cardiology

## 2015-04-25 ENCOUNTER — Encounter: Payer: Self-pay | Admitting: Cardiology

## 2015-04-25 VITALS — BP 108/78 | HR 86 | Ht 75.0 in | Wt 221.4 lb

## 2015-04-25 DIAGNOSIS — I2583 Coronary atherosclerosis due to lipid rich plaque: Secondary | ICD-10-CM

## 2015-04-25 DIAGNOSIS — I10 Essential (primary) hypertension: Secondary | ICD-10-CM | POA: Diagnosis not present

## 2015-04-25 DIAGNOSIS — I2510 Atherosclerotic heart disease of native coronary artery without angina pectoris: Secondary | ICD-10-CM

## 2015-04-25 DIAGNOSIS — I712 Thoracic aortic aneurysm, without rupture, unspecified: Secondary | ICD-10-CM | POA: Insufficient documentation

## 2015-04-25 NOTE — Assessment & Plan Note (Signed)
Blood pressure controlled. Continue present medications. 

## 2015-04-25 NOTE — Patient Instructions (Signed)
Medication Instructions:   NO CHANGE  Labwork:  Your physician recommends that you HAVE LAB WORK TODAY  Testing/Procedures:  Non-Cardiac CT Angiography (CTA), is a special type of CT scan that uses a computer to produce multi-dimensional views of major blood vessels throughout the body. In CT angiography, a contrast material is injected through an IV to help visualize the blood vessels CTA OF THE CHEST AORTA W/WO TO FOLLOW UP THORACIC ANEURYSM AT THE [facility]  Follow-Up:  Your physician wants you to follow-up in: [place] will receive a reminder letter in the mail two months in advance. If you don't receive a letter, please call our office to schedule the follow-up appointment.

## 2015-04-25 NOTE — Assessment & Plan Note (Signed)
Patient with previous CVA.He had some confusion but apparently is improving. Continue aspirin and statin.

## 2015-04-25 NOTE — Assessment & Plan Note (Signed)
Patient had a dilated aortic root and moderately dilated sinus of Valsalva on previous echo. Plan CTA to further evaluate.

## 2015-04-25 NOTE — Assessment & Plan Note (Signed)
Continue aspirin and statin. 

## 2015-04-25 NOTE — Assessment & Plan Note (Signed)
Continue statin. 

## 2015-04-26 ENCOUNTER — Other Ambulatory Visit: Payer: Self-pay | Admitting: Family Medicine

## 2015-04-26 LAB — BASIC METABOLIC PANEL
BUN: 19 mg/dL (ref 7–25)
CALCIUM: 9 mg/dL (ref 8.6–10.3)
CO2: 23 mmol/L (ref 20–31)
CREATININE: 1.08 mg/dL (ref 0.70–1.18)
Chloride: 105 mmol/L (ref 98–110)
GLUCOSE: 115 mg/dL — AB (ref 65–99)
Potassium: 4.5 mmol/L (ref 3.5–5.3)
Sodium: 139 mmol/L (ref 135–146)

## 2015-04-27 ENCOUNTER — Encounter (HOSPITAL_BASED_OUTPATIENT_CLINIC_OR_DEPARTMENT_OTHER): Payer: Self-pay

## 2015-04-27 ENCOUNTER — Ambulatory Visit (HOSPITAL_BASED_OUTPATIENT_CLINIC_OR_DEPARTMENT_OTHER)
Admission: RE | Admit: 2015-04-27 | Discharge: 2015-04-27 | Disposition: A | Discharge status: Home / Self Care | Payer: Medicare Other | Source: Ambulatory Visit | Attending: Cardiology | Admitting: Cardiology

## 2015-04-27 DIAGNOSIS — I77810 Thoracic aortic ectasia: Secondary | ICD-10-CM | POA: Insufficient documentation

## 2015-04-27 DIAGNOSIS — I700 Atherosclerosis of aorta: Secondary | ICD-10-CM | POA: Insufficient documentation

## 2015-04-27 DIAGNOSIS — I712 Thoracic aortic aneurysm, without rupture, unspecified: Secondary | ICD-10-CM

## 2015-04-27 MED ORDER — IOPAMIDOL (ISOVUE-370) INJECTION 76%
100.0000 mL | Freq: Once | INTRAVENOUS | Status: AC | PRN
  Administered 2015-04-27: 100 mL via INTRAVENOUS

## 2015-05-04 DIAGNOSIS — G3184 Mild cognitive impairment, so stated: Secondary | ICD-10-CM | POA: Diagnosis not present

## 2015-05-04 DIAGNOSIS — N401 Enlarged prostate with lower urinary tract symptoms: Secondary | ICD-10-CM | POA: Diagnosis not present

## 2015-05-04 DIAGNOSIS — I639 Cerebral infarction, unspecified: Secondary | ICD-10-CM | POA: Diagnosis not present

## 2015-05-04 DIAGNOSIS — R350 Frequency of micturition: Secondary | ICD-10-CM | POA: Diagnosis not present

## 2015-05-04 DIAGNOSIS — E1149 Type 2 diabetes mellitus with other diabetic neurological complication: Secondary | ICD-10-CM | POA: Diagnosis not present

## 2015-05-08 ENCOUNTER — Other Ambulatory Visit: Payer: Self-pay | Admitting: Family Medicine

## 2015-05-12 ENCOUNTER — Ambulatory Visit: Payer: TRICARE For Life (TFL) | Admitting: Family Medicine

## 2015-06-13 DIAGNOSIS — G3184 Mild cognitive impairment, so stated: Secondary | ICD-10-CM | POA: Diagnosis not present

## 2015-06-13 DIAGNOSIS — F0391 Unspecified dementia with behavioral disturbance: Secondary | ICD-10-CM | POA: Diagnosis not present

## 2015-06-13 DIAGNOSIS — Z87891 Personal history of nicotine dependence: Secondary | ICD-10-CM | POA: Diagnosis not present

## 2015-06-13 DIAGNOSIS — I10 Essential (primary) hypertension: Secondary | ICD-10-CM | POA: Diagnosis not present

## 2015-06-13 DIAGNOSIS — I639 Cerebral infarction, unspecified: Secondary | ICD-10-CM | POA: Diagnosis not present

## 2015-06-19 ENCOUNTER — Other Ambulatory Visit: Payer: Self-pay | Admitting: Family Medicine

## 2015-07-18 ENCOUNTER — Other Ambulatory Visit: Payer: Self-pay | Admitting: Cardiology

## 2015-07-18 NOTE — Telephone Encounter (Signed)
Rx(s) sent to pharmacy electronically.  

## 2015-07-25 ENCOUNTER — Other Ambulatory Visit: Payer: Self-pay | Admitting: Family Medicine

## 2015-08-09 ENCOUNTER — Other Ambulatory Visit: Payer: Self-pay | Admitting: Family Medicine

## 2015-09-01 ENCOUNTER — Ambulatory Visit (INDEPENDENT_AMBULATORY_CARE_PROVIDER_SITE_OTHER): Payer: Medicare Other | Admitting: Family Medicine

## 2015-09-01 ENCOUNTER — Encounter: Payer: Self-pay | Admitting: Family Medicine

## 2015-09-01 VITALS — BP 130/86 | HR 83 | Resp 13 | Wt 233.0 lb

## 2015-09-01 DIAGNOSIS — N183 Chronic kidney disease, stage 3 unspecified: Secondary | ICD-10-CM

## 2015-09-01 DIAGNOSIS — E1122 Type 2 diabetes mellitus with diabetic chronic kidney disease: Secondary | ICD-10-CM

## 2015-09-01 DIAGNOSIS — F0390 Unspecified dementia without behavioral disturbance: Secondary | ICD-10-CM | POA: Diagnosis not present

## 2015-09-01 DIAGNOSIS — I2510 Atherosclerotic heart disease of native coronary artery without angina pectoris: Secondary | ICD-10-CM | POA: Diagnosis not present

## 2015-09-01 DIAGNOSIS — I10 Essential (primary) hypertension: Secondary | ICD-10-CM

## 2015-09-01 DIAGNOSIS — I2583 Coronary atherosclerosis due to lipid rich plaque: Secondary | ICD-10-CM

## 2015-09-01 LAB — POCT GLYCOSYLATED HEMOGLOBIN (HGB A1C): Hemoglobin A1C: 5.9

## 2015-09-01 LAB — POCT UA - MICROALBUMIN
Albumin/Creatinine Ratio, Urine, POC: <30
Creatinine, POC: 200 mg/dL
Microalbumin Ur, POC: 30 mg/L

## 2015-09-01 MED ORDER — DOXAZOSIN MESYLATE 4 MG PO TABS: 4.0000 mg | ORAL_TABLET | Freq: Every day | ORAL | 1 refills | Status: DC

## 2015-09-01 MED ORDER — VALSARTAN 320 MG PO TABS: 320.0000 mg | ORAL_TABLET | Freq: Every day | ORAL | 1 refills | Status: DC

## 2015-09-01 NOTE — Patient Instructions (Signed)
I would stop the benadril and just use the trazodone for sleep.   Avoid all caffeine.

## 2015-09-01 NOTE — Progress Notes (Signed)
Subjective:    CC: DM, HTN  HPI: Patient is returning today to reestablish care. He has not been in our seen in our clinic for almost a year. He has been followed by primary care at [facility].  Diabetes - no hypoglycemic events. No wounds or sores that are not healing well. No increased thirst or urination. He does not check his glucose at home.. Taking medications as prescribed without any side effects.He is not very ambulatory. His son who is here with him says that he gets up and walks back and forth to the bathroom and that's about it. He often complains about his legs hurting. When I specifically asked what hurts he says his knees but then he says that they're not really bothering him Lab Results  Component Value Date   HGBA1C 5.9 09/01/2015    Hypertension- Pt denies chest pain, SOB, dizziness, or heart palpitations.  Taking meds as directed w/o problems.  Denies medication side effects.    Dementia - Cognitive decline.  He is taking Aricept.   BP 130/86 (BP Location: Left Arm, Patient Position: Sitting, Cuff Size: Normal)   Pulse 83   Resp 13   Wt 233 lb (105.7 kg)   SpO2 97%   BMI 29.12 kg/m     Allergies  Allergen Reactions  . Ativan [Lorazepam] Other (See Comments) and Anaphylaxis    Agitatoin.   . Metformin Other (See Comments)    CKD 3 cuased worsening renal level.   . Haloperidol Other (See Comments)    unknown  . Metformin And Related Other (See Comments)    CKD 3 cuased worsening renal level.     Past Medical History:  Diagnosis Date  . CAD (coronary artery disease)   . Diabetes mellitus   . ED (erectile dysfunction)   . Gout   . Hyperlipidemia   . Hypertension     Past Surgical History:  Procedure Laterality Date  . CATARACT EXTRACTION W/ INTRAOCULAR LENS IMPLANT  06/14/11   Left eye  . CATARACT EXTRACTION W/ INTRAOCULAR LENS IMPLANT  07/03/11   Right eye  . LEFT HEART CATHETERIZATION WITH CORONARY ANGIOGRAM N/A 07/14/2013   Procedure: LEFT HEART  CATHETERIZATION WITH CORONARY ANGIOGRAM;  Surgeon: Peter M Martinique, MD;  Location: [facility] CATH LAB;  Service: Cardiovascular;  Laterality: N/A;    Social History   Social History  . Marital status: Married    Spouse name: N/A  . Number of children: 3  . Years of education: N/A   Occupational History  .      Retired from [employer]  . Smoking status: Former Smoker    Quit date: 01/23/1984  . Smokeless tobacco: Not on file  . Alcohol use No  . Drug use: No  . Sexual activity: Not on file   Other Topics Concern  . Not on file   Social History Narrative  . No narrative on file    Family History  Problem Relation Age of Onset  . Diabetes Mother     Outpatient Encounter Prescriptions as of 09/01/2015  Medication Sig  . allopurinol (ZYLOPRIM) 300 MG tablet TAKE 1 TABLET DAILY  . aspirin 325 MG tablet Take 325 mg by mouth daily.  Mariane Baumgarten Sodium (STOOL SOFTENER) 100 MG capsule Take 100 mg by mouth 2 (two) times daily.  Marland Kitchen donepezil (ARICEPT) 10 MG tablet Take 10 mg by mouth at bedtime.  Marland Kitchen doxazosin (CARDURA) 4 MG tablet Take 1 tablet (4  mg total) by mouth at bedtime.  Marland Kitchen glipiZIDE (GLIPIZIDE XL) 5 MG 24 hr tablet Take 1 tablet (5 mg total) by mouth daily with breakfast.  . hydrALAZINE (APRESOLINE) 50 MG tablet Take 50 mg by mouth 3 (three) times daily.  . sertraline (ZOLOFT) 100 MG tablet Take 1 tablet by mouth daily. Take 1 tab by mouth daily  . TOPROL XL 25 MG 24 hr tablet TAKE 1 TABLET DAILY  . traZODone (DESYREL) 50 MG tablet Take 0.5 tablets (25 mg total) by mouth at bedtime. 30 MINUTES BEFORE BED (Patient taking differently: Take 50 mg by mouth at bedtime. 30 MINUTES BEFORE BED)  . valsartan (DIOVAN) 320 MG tablet Take 1 tablet (320 mg total) by mouth daily.  . vitamin B-12 (CYANOCOBALAMIN) 1000 MCG tablet Take 1,000 mcg by mouth.  . [DISCONTINUED] atorvastatin (LIPITOR) 80 MG tablet Take 1 tablet (80 mg total) by mouth daily.  . [DISCONTINUED]  doxazosin (CARDURA) 4 MG tablet Take 1 tablet (4 mg total) by mouth at bedtime. NEED FOLLOW UP APPOINTMENT FOR MORE REFILLS  . [DISCONTINUED] valsartan (DIOVAN) 320 MG tablet Take 1 tablet (320 mg total) by mouth daily. APPOINTMENT NEEDED FOR FURTHER REFILLS   No facility-administered encounter medications on file as of 09/01/2015.        Review of Systems: No fevers, chills, night sweats, weight loss, chest pain, or shortness of breath.   Objective:    General: Well Developed, well nourished, and in no acute distress.  Neuro: Alert and oriented x3, extra-ocular muscles intact, sensation grossly intact.  HEENT: Normocephalic, atraumatic  Skin: Warm and dry, no rashes. Cardiac: Regular rate and rhythm, no murmurs rubs or gallops, no lower extremity edema.  Respiratory: Clear to auscultation bilaterally. Not using accessory muscles, speaking in full sentences.   Impression and Recommendations:    DM-11 A1c of 5.9 which looks fantastic today. Exam performed today. We'll also perform urine microalbumin. I was able to find an up-to-date lipid panel. He is due for CMP.  HTN- Well controlled. Continue current regimen. Follow up in 6 mo.   Dementia - plan to do a Mini-Mental Status or MCA test at follow-up visit in 3 months.He is on Aricept.  Coronary artery disease-he is no longer on atorvastatin. I'm not exactly sure why so may need to look through the records to see if maybe he had a side effect or complication but he does have coronary artery disease.

## 2015-09-02 LAB — BASIC METABOLIC PANEL WITH GFR
BUN: 22 mg/dL (ref 7–25)
CO2: 20 mmol/L (ref 20–31)
CREATININE: 1.21 mg/dL — AB (ref 0.70–1.18)
Calcium: 9.2 mg/dL (ref 8.6–10.3)
Chloride: 105 mmol/L (ref 98–110)
GFR, EST AFRICAN AMERICAN: 65 mL/min (ref >=60)
GFR, Est Non African American: 57 mL/min — ABNORMAL LOW (ref >=60)
GLUCOSE: 104 mg/dL — AB (ref 65–99)
POTASSIUM: 4.2 mmol/L (ref 3.5–5.3)
Sodium: 140 mmol/L (ref 135–146)

## 2015-09-13 ENCOUNTER — Encounter: Payer: Self-pay | Admitting: *Deleted

## 2015-12-01 ENCOUNTER — Ambulatory Visit: Payer: TRICARE For Life (TFL) | Admitting: Family Medicine

## 2015-12-02 ENCOUNTER — Ambulatory Visit: Payer: TRICARE For Life (TFL) | Admitting: Family Medicine

## 2015-12-07 DIAGNOSIS — R4182 Altered mental status, unspecified: Secondary | ICD-10-CM | POA: Diagnosis not present

## 2015-12-07 DIAGNOSIS — N10 Acute tubulo-interstitial nephritis: Secondary | ICD-10-CM | POA: Diagnosis present

## 2015-12-07 DIAGNOSIS — Z7984 Long term (current) use of oral hypoglycemic drugs: Secondary | ICD-10-CM | POA: Diagnosis not present

## 2015-12-07 DIAGNOSIS — I471 Supraventricular tachycardia: Secondary | ICD-10-CM | POA: Diagnosis not present

## 2015-12-07 DIAGNOSIS — Z79899 Other long term (current) drug therapy: Secondary | ICD-10-CM | POA: Diagnosis not present

## 2015-12-07 DIAGNOSIS — N390 Urinary tract infection, site not specified: Secondary | ICD-10-CM | POA: Diagnosis present

## 2015-12-07 DIAGNOSIS — N179 Acute kidney failure, unspecified: Secondary | ICD-10-CM | POA: Diagnosis not present

## 2015-12-07 DIAGNOSIS — I6789 Other cerebrovascular disease: Secondary | ICD-10-CM | POA: Diagnosis not present

## 2015-12-07 DIAGNOSIS — B9620 Unspecified Escherichia coli [E. coli] as the cause of diseases classified elsewhere: Secondary | ICD-10-CM | POA: Diagnosis present

## 2015-12-07 DIAGNOSIS — Z888 Allergy status to other drugs, medicaments and biological substances status: Secondary | ICD-10-CM | POA: Diagnosis not present

## 2015-12-07 DIAGNOSIS — Z885 Allergy status to narcotic agent status: Secondary | ICD-10-CM | POA: Diagnosis not present

## 2015-12-07 DIAGNOSIS — N400 Enlarged prostate without lower urinary tract symptoms: Secondary | ICD-10-CM | POA: Diagnosis not present

## 2015-12-07 DIAGNOSIS — Z7982 Long term (current) use of aspirin: Secondary | ICD-10-CM | POA: Diagnosis not present

## 2015-12-07 DIAGNOSIS — I129 Hypertensive chronic kidney disease with stage 1 through stage 4 chronic kidney disease, or unspecified chronic kidney disease: Secondary | ICD-10-CM | POA: Diagnosis present

## 2015-12-07 DIAGNOSIS — Z781 Physical restraint status: Secondary | ICD-10-CM | POA: Diagnosis not present

## 2015-12-07 DIAGNOSIS — R0602 Shortness of breath: Secondary | ICD-10-CM | POA: Diagnosis not present

## 2015-12-07 DIAGNOSIS — I6523 Occlusion and stenosis of bilateral carotid arteries: Secondary | ICD-10-CM | POA: Diagnosis not present

## 2015-12-07 DIAGNOSIS — N12 Tubulo-interstitial nephritis, not specified as acute or chronic: Secondary | ICD-10-CM | POA: Diagnosis not present

## 2015-12-07 DIAGNOSIS — E1122 Type 2 diabetes mellitus with diabetic chronic kidney disease: Secondary | ICD-10-CM | POA: Diagnosis present

## 2015-12-07 DIAGNOSIS — R509 Fever, unspecified: Secondary | ICD-10-CM | POA: Diagnosis not present

## 2015-12-07 DIAGNOSIS — Z8673 Personal history of transient ischemic attack (TIA), and cerebral infarction without residual deficits: Secondary | ICD-10-CM | POA: Diagnosis not present

## 2015-12-07 DIAGNOSIS — G300 Alzheimer's disease with early onset: Secondary | ICD-10-CM | POA: Diagnosis present

## 2015-12-07 DIAGNOSIS — F05 Delirium due to known physiological condition: Secondary | ICD-10-CM | POA: Diagnosis present

## 2015-12-07 DIAGNOSIS — N183 Chronic kidney disease, stage 3 (moderate): Secondary | ICD-10-CM | POA: Diagnosis present

## 2015-12-07 DIAGNOSIS — E860 Dehydration: Secondary | ICD-10-CM | POA: Diagnosis present

## 2015-12-07 DIAGNOSIS — R29818 Other symptoms and signs involving the nervous system: Secondary | ICD-10-CM | POA: Diagnosis not present

## 2015-12-07 DIAGNOSIS — N419 Inflammatory disease of prostate, unspecified: Secondary | ICD-10-CM | POA: Diagnosis present

## 2015-12-07 DIAGNOSIS — F0281 Dementia in other diseases classified elsewhere with behavioral disturbance: Secondary | ICD-10-CM | POA: Diagnosis not present

## 2015-12-07 DIAGNOSIS — E119 Type 2 diabetes mellitus without complications: Secondary | ICD-10-CM | POA: Diagnosis not present

## 2015-12-07 DIAGNOSIS — F0280 Dementia in other diseases classified elsewhere without behavioral disturbance: Secondary | ICD-10-CM | POA: Diagnosis present

## 2015-12-07 DIAGNOSIS — I639 Cerebral infarction, unspecified: Secondary | ICD-10-CM | POA: Diagnosis not present

## 2015-12-07 DIAGNOSIS — A419 Sepsis, unspecified organism: Secondary | ICD-10-CM | POA: Diagnosis not present

## 2015-12-07 DIAGNOSIS — G9340 Encephalopathy, unspecified: Secondary | ICD-10-CM | POA: Diagnosis present

## 2015-12-07 DIAGNOSIS — R531 Weakness: Secondary | ICD-10-CM | POA: Diagnosis not present

## 2015-12-07 DIAGNOSIS — K5790 Diverticulosis of intestine, part unspecified, without perforation or abscess without bleeding: Secondary | ICD-10-CM | POA: Diagnosis not present

## 2015-12-07 DIAGNOSIS — I472 Ventricular tachycardia: Secondary | ICD-10-CM | POA: Diagnosis not present

## 2015-12-07 DIAGNOSIS — K8020 Calculus of gallbladder without cholecystitis without obstruction: Secondary | ICD-10-CM | POA: Diagnosis not present

## 2015-12-12 ENCOUNTER — Ambulatory Visit: Payer: TRICARE For Life (TFL) | Admitting: Family Medicine

## 2015-12-14 DIAGNOSIS — I129 Hypertensive chronic kidney disease with stage 1 through stage 4 chronic kidney disease, or unspecified chronic kidney disease: Secondary | ICD-10-CM | POA: Diagnosis not present

## 2015-12-14 DIAGNOSIS — E1122 Type 2 diabetes mellitus with diabetic chronic kidney disease: Secondary | ICD-10-CM | POA: Diagnosis not present

## 2015-12-14 DIAGNOSIS — G300 Alzheimer's disease with early onset: Secondary | ICD-10-CM | POA: Diagnosis not present

## 2015-12-14 DIAGNOSIS — A419 Sepsis, unspecified organism: Secondary | ICD-10-CM | POA: Diagnosis not present

## 2015-12-14 DIAGNOSIS — Z7984 Long term (current) use of oral hypoglycemic drugs: Secondary | ICD-10-CM | POA: Diagnosis not present

## 2015-12-14 DIAGNOSIS — F0281 Dementia in other diseases classified elsewhere with behavioral disturbance: Secondary | ICD-10-CM | POA: Diagnosis not present

## 2015-12-14 DIAGNOSIS — Z7982 Long term (current) use of aspirin: Secondary | ICD-10-CM | POA: Diagnosis not present

## 2015-12-14 DIAGNOSIS — N183 Chronic kidney disease, stage 3 (moderate): Secondary | ICD-10-CM | POA: Diagnosis not present

## 2015-12-14 DIAGNOSIS — N10 Acute tubulo-interstitial nephritis: Secondary | ICD-10-CM | POA: Diagnosis not present

## 2015-12-18 DIAGNOSIS — G300 Alzheimer's disease with early onset: Secondary | ICD-10-CM | POA: Diagnosis not present

## 2015-12-18 DIAGNOSIS — A419 Sepsis, unspecified organism: Secondary | ICD-10-CM | POA: Diagnosis not present

## 2015-12-18 DIAGNOSIS — N10 Acute tubulo-interstitial nephritis: Secondary | ICD-10-CM | POA: Diagnosis not present

## 2015-12-18 DIAGNOSIS — E1122 Type 2 diabetes mellitus with diabetic chronic kidney disease: Secondary | ICD-10-CM | POA: Diagnosis not present

## 2015-12-18 DIAGNOSIS — F0281 Dementia in other diseases classified elsewhere with behavioral disturbance: Secondary | ICD-10-CM | POA: Diagnosis not present

## 2015-12-18 DIAGNOSIS — I129 Hypertensive chronic kidney disease with stage 1 through stage 4 chronic kidney disease, or unspecified chronic kidney disease: Secondary | ICD-10-CM | POA: Diagnosis not present

## 2015-12-20 DIAGNOSIS — F0281 Dementia in other diseases classified elsewhere with behavioral disturbance: Secondary | ICD-10-CM | POA: Diagnosis not present

## 2015-12-20 DIAGNOSIS — G300 Alzheimer's disease with early onset: Secondary | ICD-10-CM | POA: Diagnosis not present

## 2015-12-20 DIAGNOSIS — E1122 Type 2 diabetes mellitus with diabetic chronic kidney disease: Secondary | ICD-10-CM | POA: Diagnosis not present

## 2015-12-20 DIAGNOSIS — I129 Hypertensive chronic kidney disease with stage 1 through stage 4 chronic kidney disease, or unspecified chronic kidney disease: Secondary | ICD-10-CM | POA: Diagnosis not present

## 2015-12-20 DIAGNOSIS — N10 Acute tubulo-interstitial nephritis: Secondary | ICD-10-CM | POA: Diagnosis not present

## 2015-12-20 DIAGNOSIS — A419 Sepsis, unspecified organism: Secondary | ICD-10-CM | POA: Diagnosis not present

## 2015-12-21 ENCOUNTER — Ambulatory Visit (INDEPENDENT_AMBULATORY_CARE_PROVIDER_SITE_OTHER): Payer: Medicare Other | Admitting: Family Medicine

## 2015-12-21 ENCOUNTER — Encounter: Payer: Self-pay | Admitting: Family Medicine

## 2015-12-21 VITALS — BP 122/81 | HR 94 | Temp 98.2°F | Wt 238.0 lb

## 2015-12-21 DIAGNOSIS — I2583 Coronary atherosclerosis due to lipid rich plaque: Secondary | ICD-10-CM | POA: Diagnosis not present

## 2015-12-21 DIAGNOSIS — N183 Chronic kidney disease, stage 3 unspecified: Secondary | ICD-10-CM

## 2015-12-21 DIAGNOSIS — Z66 Do not resuscitate: Secondary | ICD-10-CM | POA: Diagnosis not present

## 2015-12-21 DIAGNOSIS — I2510 Atherosclerotic heart disease of native coronary artery without angina pectoris: Secondary | ICD-10-CM | POA: Diagnosis not present

## 2015-12-21 DIAGNOSIS — A419 Sepsis, unspecified organism: Secondary | ICD-10-CM | POA: Diagnosis not present

## 2015-12-21 DIAGNOSIS — N10 Acute tubulo-interstitial nephritis: Secondary | ICD-10-CM | POA: Diagnosis not present

## 2015-12-21 DIAGNOSIS — E1122 Type 2 diabetes mellitus with diabetic chronic kidney disease: Secondary | ICD-10-CM | POA: Diagnosis not present

## 2015-12-21 DIAGNOSIS — G300 Alzheimer's disease with early onset: Secondary | ICD-10-CM | POA: Diagnosis not present

## 2015-12-21 DIAGNOSIS — I129 Hypertensive chronic kidney disease with stage 1 through stage 4 chronic kidney disease, or unspecified chronic kidney disease: Secondary | ICD-10-CM | POA: Diagnosis not present

## 2015-12-21 DIAGNOSIS — F0281 Dementia in other diseases classified elsewhere with behavioral disturbance: Secondary | ICD-10-CM | POA: Diagnosis not present

## 2015-12-21 NOTE — Progress Notes (Signed)
Joshua Scott is a 81 y.o. male who presents to [facility] today for hospital follow-up. Patient was discharged on about November 20 from the local hospital for sepsis resulting from pyelonephritis resulting from a urinary tract infection. His hospital course was complicated by acute renal injury as well as acute delirium. The blood culture grew Escherichia coli pansensitive. He was discharged on ciprofloxacin. Today is his last day of ciprofloxacin. He's feeling much better according to his son who attends with him today. The acute renal injury resulting in a max creatinine of 1.7 trending down to 1.0 by discharge.  He is back to his normal status per his family. His mental health. Located by history of moderate dementia due to Alzheimer's. He is at his mental baseline according to his son.   Past Medical History:  Diagnosis Date  . CAD (coronary artery disease)   . Diabetes mellitus   . ED (erectile dysfunction)   . Gout   . Hyperlipidemia   . Hypertension    Past Surgical History:  Procedure Laterality Date  . CATARACT EXTRACTION W/ INTRAOCULAR LENS IMPLANT  06/14/11   Left eye  . CATARACT EXTRACTION W/ INTRAOCULAR LENS IMPLANT  07/03/11   Right eye  . LEFT HEART CATHETERIZATION WITH CORONARY ANGIOGRAM N/A 07/14/2013   Procedure: LEFT HEART CATHETERIZATION WITH CORONARY ANGIOGRAM;  Surgeon: Peter M Martinique, MD;  Location: [facility] CATH LAB;  Service: Cardiovascular;  Laterality: N/A;   Social History  Substance Use Topics  . Smoking status: Former Smoker    Quit date: 01/23/1984  . Smokeless tobacco: Not on file  . Alcohol use No   family history includes Diabetes in his mother.  ROS as above:  Medications: Current Outpatient Prescriptions  Medication Sig Dispense Refill  . allopurinol (ZYLOPRIM) 300 MG tablet TAKE 1 TABLET DAILY 90 tablet 1  . aspirin 325 MG tablet  Take 325 mg by mouth daily.    Mariane Baumgarten Sodium (STOOL SOFTENER) 100 MG capsule Take 100 mg by mouth 2 (two) times daily.    Marland Kitchen donepezil (ARICEPT) 10 MG tablet Take 10 mg by mouth at bedtime.    Marland Kitchen doxazosin (CARDURA) 4 MG tablet Take 1 tablet (4 mg total) by mouth at bedtime. 90 tablet 1  . glipiZIDE (GLIPIZIDE XL) 5 MG 24 hr tablet Take 1 tablet (5 mg total) by mouth daily with breakfast. 90 tablet 2  . sertraline (ZOLOFT) 100 MG tablet Take 1 tablet by mouth daily. Take 1 tab by mouth daily    . TOPROL XL 25 MG 24 hr tablet TAKE 1 TABLET DAILY 90 tablet 2  . traZODone (DESYREL) 50 MG tablet Take 0.5 tablets (25 mg total) by mouth at bedtime. 30 MINUTES BEFORE BED (Patient taking differently: Take 50 mg by mouth at bedtime. 30 MINUTES BEFORE BED) 30 tablet 0  . valsartan (DIOVAN) 320 MG tablet Take 1 tablet (320 mg total) by mouth daily. 90 tablet 1  . vitamin B-12 (CYANOCOBALAMIN) 1000 MCG tablet Take 1,000 mcg by mouth.     No current facility-administered medications for this visit.    Allergies  Allergen Reactions  . Ativan [Lorazepam] Other (See Comments) and Anaphylaxis    Agitatoin.   . Metformin Other (See Comments)    CKD 3 cuased worsening renal level.   . Haloperidol Other (See Comments)    unknown  . Metformin And Related Other (See Comments)    CKD 3 cuased worsening  renal level.     Health Maintenance Health Maintenance  Topic Date Due  . OPHTHALMOLOGY EXAM  05/13/2015  . HEMOGLOBIN A1C  03/03/2016  . FOOT EXAM  08/31/2016  . TETANUS/TDAP  04/26/2022  . INFLUENZA VACCINE  Addressed  . ZOSTAVAX  Completed  . PNA vac Low Risk Adult  Completed     Exam:  BP 122/81   Pulse 94   Temp 98.2 F (36.8 C) (Oral)   Wt 238 lb (108 kg)   SpO2 94%   BMI 29.75 kg/m  Gen: Well NAD Elderly appearing HEENT: EOMI,  MMM Lungs: Normal work of breathing. CTABL Heart: RRR no MRG Abd: NABS, Soft. Nondistended, Nontender no CVA angle tenderness to percussion Exts: Brisk  capillary refill, warm and well perfused.  Neuro psych: Alert. Not oriented to location or time. Oriented to person. Moves all extremities and walks slowly using a walker   No results found for this or any previous visit (from the past 72 hour(s)). No results found.    Assessment and Plan: 81 y.o. male with  Hospital follow-up from sepsis resulting from pyelonephritis from UTI. This is complicated by delirium complicating his existing moderate dementia. He appears to be reasonably at baseline. Additionally he had acute renal injury that has returned to baseline as well. Recommend following up with his PCP in the near future to recheck kidney function and potential repeat urine culture for test of cure.  Return sooner if needed.   No orders of the defined types were placed in this encounter.   Discussed warning signs or symptoms. Please see discharge instructions. Patient expresses understanding.  I spent 40 minutes with this patient, greater than 50% was face-to-face time counseling regarding the above diagnosis.

## 2015-12-21 NOTE — Patient Instructions (Signed)
Thank you for coming in today. Return to Dr Madilyn Fireman in about 1 month.  Return as needed.

## 2015-12-22 DIAGNOSIS — G300 Alzheimer's disease with early onset: Secondary | ICD-10-CM | POA: Diagnosis not present

## 2015-12-22 DIAGNOSIS — I129 Hypertensive chronic kidney disease with stage 1 through stage 4 chronic kidney disease, or unspecified chronic kidney disease: Secondary | ICD-10-CM | POA: Diagnosis not present

## 2015-12-22 DIAGNOSIS — A419 Sepsis, unspecified organism: Secondary | ICD-10-CM | POA: Diagnosis not present

## 2015-12-22 DIAGNOSIS — F0281 Dementia in other diseases classified elsewhere with behavioral disturbance: Secondary | ICD-10-CM | POA: Diagnosis not present

## 2015-12-22 DIAGNOSIS — E1122 Type 2 diabetes mellitus with diabetic chronic kidney disease: Secondary | ICD-10-CM | POA: Diagnosis not present

## 2015-12-22 DIAGNOSIS — N10 Acute tubulo-interstitial nephritis: Secondary | ICD-10-CM | POA: Diagnosis not present

## 2015-12-23 DIAGNOSIS — N10 Acute tubulo-interstitial nephritis: Secondary | ICD-10-CM | POA: Diagnosis not present

## 2015-12-23 DIAGNOSIS — G300 Alzheimer's disease with early onset: Secondary | ICD-10-CM | POA: Diagnosis not present

## 2015-12-23 DIAGNOSIS — I129 Hypertensive chronic kidney disease with stage 1 through stage 4 chronic kidney disease, or unspecified chronic kidney disease: Secondary | ICD-10-CM | POA: Diagnosis not present

## 2015-12-23 DIAGNOSIS — F0281 Dementia in other diseases classified elsewhere with behavioral disturbance: Secondary | ICD-10-CM | POA: Diagnosis not present

## 2015-12-23 DIAGNOSIS — E1122 Type 2 diabetes mellitus with diabetic chronic kidney disease: Secondary | ICD-10-CM | POA: Diagnosis not present

## 2015-12-23 DIAGNOSIS — A419 Sepsis, unspecified organism: Secondary | ICD-10-CM | POA: Diagnosis not present

## 2015-12-26 DIAGNOSIS — A419 Sepsis, unspecified organism: Secondary | ICD-10-CM | POA: Diagnosis not present

## 2015-12-26 DIAGNOSIS — F0281 Dementia in other diseases classified elsewhere with behavioral disturbance: Secondary | ICD-10-CM | POA: Diagnosis not present

## 2015-12-26 DIAGNOSIS — I129 Hypertensive chronic kidney disease with stage 1 through stage 4 chronic kidney disease, or unspecified chronic kidney disease: Secondary | ICD-10-CM | POA: Diagnosis not present

## 2015-12-26 DIAGNOSIS — G300 Alzheimer's disease with early onset: Secondary | ICD-10-CM | POA: Diagnosis not present

## 2015-12-26 DIAGNOSIS — N10 Acute tubulo-interstitial nephritis: Secondary | ICD-10-CM | POA: Diagnosis not present

## 2015-12-26 DIAGNOSIS — E1122 Type 2 diabetes mellitus with diabetic chronic kidney disease: Secondary | ICD-10-CM | POA: Diagnosis not present

## 2015-12-27 DIAGNOSIS — A419 Sepsis, unspecified organism: Secondary | ICD-10-CM | POA: Diagnosis not present

## 2015-12-27 DIAGNOSIS — N10 Acute tubulo-interstitial nephritis: Secondary | ICD-10-CM | POA: Diagnosis not present

## 2015-12-27 DIAGNOSIS — G300 Alzheimer's disease with early onset: Secondary | ICD-10-CM | POA: Diagnosis not present

## 2015-12-27 DIAGNOSIS — I129 Hypertensive chronic kidney disease with stage 1 through stage 4 chronic kidney disease, or unspecified chronic kidney disease: Secondary | ICD-10-CM | POA: Diagnosis not present

## 2015-12-27 DIAGNOSIS — F0281 Dementia in other diseases classified elsewhere with behavioral disturbance: Secondary | ICD-10-CM | POA: Diagnosis not present

## 2015-12-27 DIAGNOSIS — E1122 Type 2 diabetes mellitus with diabetic chronic kidney disease: Secondary | ICD-10-CM | POA: Diagnosis not present

## 2015-12-28 DIAGNOSIS — F0281 Dementia in other diseases classified elsewhere with behavioral disturbance: Secondary | ICD-10-CM | POA: Diagnosis not present

## 2015-12-28 DIAGNOSIS — I129 Hypertensive chronic kidney disease with stage 1 through stage 4 chronic kidney disease, or unspecified chronic kidney disease: Secondary | ICD-10-CM | POA: Diagnosis not present

## 2015-12-28 DIAGNOSIS — G300 Alzheimer's disease with early onset: Secondary | ICD-10-CM | POA: Diagnosis not present

## 2015-12-28 DIAGNOSIS — N10 Acute tubulo-interstitial nephritis: Secondary | ICD-10-CM | POA: Diagnosis not present

## 2015-12-28 DIAGNOSIS — A419 Sepsis, unspecified organism: Secondary | ICD-10-CM | POA: Diagnosis not present

## 2015-12-28 DIAGNOSIS — E1122 Type 2 diabetes mellitus with diabetic chronic kidney disease: Secondary | ICD-10-CM | POA: Diagnosis not present

## 2015-12-29 DIAGNOSIS — G300 Alzheimer's disease with early onset: Secondary | ICD-10-CM | POA: Diagnosis not present

## 2015-12-29 DIAGNOSIS — A419 Sepsis, unspecified organism: Secondary | ICD-10-CM | POA: Diagnosis not present

## 2015-12-29 DIAGNOSIS — I129 Hypertensive chronic kidney disease with stage 1 through stage 4 chronic kidney disease, or unspecified chronic kidney disease: Secondary | ICD-10-CM | POA: Diagnosis not present

## 2015-12-29 DIAGNOSIS — E1122 Type 2 diabetes mellitus with diabetic chronic kidney disease: Secondary | ICD-10-CM | POA: Diagnosis not present

## 2015-12-29 DIAGNOSIS — F0281 Dementia in other diseases classified elsewhere with behavioral disturbance: Secondary | ICD-10-CM | POA: Diagnosis not present

## 2015-12-29 DIAGNOSIS — N10 Acute tubulo-interstitial nephritis: Secondary | ICD-10-CM | POA: Diagnosis not present

## 2015-12-30 ENCOUNTER — Other Ambulatory Visit: Payer: Self-pay

## 2016-01-03 DIAGNOSIS — I129 Hypertensive chronic kidney disease with stage 1 through stage 4 chronic kidney disease, or unspecified chronic kidney disease: Secondary | ICD-10-CM | POA: Diagnosis not present

## 2016-01-03 DIAGNOSIS — G300 Alzheimer's disease with early onset: Secondary | ICD-10-CM | POA: Diagnosis not present

## 2016-01-03 DIAGNOSIS — E1122 Type 2 diabetes mellitus with diabetic chronic kidney disease: Secondary | ICD-10-CM | POA: Diagnosis not present

## 2016-01-03 DIAGNOSIS — A419 Sepsis, unspecified organism: Secondary | ICD-10-CM | POA: Diagnosis not present

## 2016-01-03 DIAGNOSIS — N10 Acute tubulo-interstitial nephritis: Secondary | ICD-10-CM | POA: Diagnosis not present

## 2016-01-03 DIAGNOSIS — F0281 Dementia in other diseases classified elsewhere with behavioral disturbance: Secondary | ICD-10-CM | POA: Diagnosis not present

## 2016-01-06 DIAGNOSIS — N10 Acute tubulo-interstitial nephritis: Secondary | ICD-10-CM | POA: Diagnosis not present

## 2016-01-06 DIAGNOSIS — A419 Sepsis, unspecified organism: Secondary | ICD-10-CM | POA: Diagnosis not present

## 2016-01-06 DIAGNOSIS — G300 Alzheimer's disease with early onset: Secondary | ICD-10-CM | POA: Diagnosis not present

## 2016-01-06 DIAGNOSIS — F0281 Dementia in other diseases classified elsewhere with behavioral disturbance: Secondary | ICD-10-CM | POA: Diagnosis not present

## 2016-01-06 DIAGNOSIS — I129 Hypertensive chronic kidney disease with stage 1 through stage 4 chronic kidney disease, or unspecified chronic kidney disease: Secondary | ICD-10-CM | POA: Diagnosis not present

## 2016-01-06 DIAGNOSIS — E1122 Type 2 diabetes mellitus with diabetic chronic kidney disease: Secondary | ICD-10-CM | POA: Diagnosis not present

## 2016-01-09 ENCOUNTER — Ambulatory Visit (INDEPENDENT_AMBULATORY_CARE_PROVIDER_SITE_OTHER): Payer: Medicare Other | Admitting: Family Medicine

## 2016-01-09 ENCOUNTER — Ambulatory Visit (INDEPENDENT_AMBULATORY_CARE_PROVIDER_SITE_OTHER): Payer: Medicare Other

## 2016-01-09 VITALS — BP 124/83 | HR 94 | Temp 98.3°F | Resp 12 | Wt 230.0 lb

## 2016-01-09 DIAGNOSIS — I2583 Coronary atherosclerosis due to lipid rich plaque: Secondary | ICD-10-CM

## 2016-01-09 DIAGNOSIS — F0390 Unspecified dementia without behavioral disturbance: Secondary | ICD-10-CM

## 2016-01-09 DIAGNOSIS — I2510 Atherosclerotic heart disease of native coronary artery without angina pectoris: Secondary | ICD-10-CM | POA: Diagnosis not present

## 2016-01-09 DIAGNOSIS — R05 Cough: Secondary | ICD-10-CM | POA: Diagnosis not present

## 2016-01-09 DIAGNOSIS — F03B0 Unspecified dementia, moderate, without behavioral disturbance, psychotic disturbance, mood disturbance, and anxiety: Secondary | ICD-10-CM

## 2016-01-09 DIAGNOSIS — I6350 Cerebral infarction due to unspecified occlusion or stenosis of unspecified cerebral artery: Secondary | ICD-10-CM | POA: Diagnosis not present

## 2016-01-09 DIAGNOSIS — I700 Atherosclerosis of aorta: Secondary | ICD-10-CM | POA: Diagnosis not present

## 2016-01-09 DIAGNOSIS — Z87891 Personal history of nicotine dependence: Secondary | ICD-10-CM

## 2016-01-09 DIAGNOSIS — Z8673 Personal history of transient ischemic attack (TIA), and cerebral infarction without residual deficits: Secondary | ICD-10-CM | POA: Diagnosis not present

## 2016-01-09 DIAGNOSIS — F03C0 Unspecified dementia, severe, without behavioral disturbance, psychotic disturbance, mood disturbance, and anxiety: Secondary | ICD-10-CM | POA: Insufficient documentation

## 2016-01-09 DIAGNOSIS — R059 Cough, unspecified: Secondary | ICD-10-CM

## 2016-01-09 NOTE — Patient Instructions (Addendum)
Thank you for coming in today. Call or go to the emergency room if you get worse, have trouble breathing, have chest pains, or palpitations.  Continue over the counter medicine.  Return if worse.  Follow up with Dr. Madilyn Fireman soon.    Cough, Adult Introduction A cough helps to clear your throat and lungs. A cough may last only 2-3 weeks (acute), or it may last longer than 8 weeks (chronic). Many different things can cause a cough. A cough may be a sign of an illness or another medical condition. Follow these instructions at home:  Pay attention to any changes in your cough.  Take medicines only as told by your doctor.  If you were prescribed an antibiotic medicine, take it as told by your doctor. Do not stop taking it even if you start to feel better.  Talk with your doctor before you try using a cough medicine.  Drink enough fluid to keep your pee (urine) clear or pale yellow.  If the air is dry, use a cold steam vaporizer or humidifier in your home.  Stay away from things that make you cough at work or at home.  If your cough is worse at night, try using extra pillows to raise your head up higher while you sleep.  Do not smoke, and try not to be around smoke. If you need help quitting, ask your doctor.  Do not have caffeine.  Do not drink alcohol.  Rest as needed. Contact a doctor if:  You have new problems (symptoms).  You cough up yellow fluid (pus).  Your cough does not get better after 2-3 weeks, or your cough gets worse.  Medicine does not help your cough and you are not sleeping well.  You have pain that gets worse or pain that is not helped with medicine.  You have a fever.  You are losing weight and you do not know why.  You have night sweats. Get help right away if:  You cough up blood.  You have trouble breathing.  Your heartbeat is very fast. This information is not intended to replace advice given to you by your health care provider. Make sure  you discuss any questions you have with your health care provider. Document Released: 09/21/2010 Document Revised: 06/16/2015 Document Reviewed: 03/17/2014  2017 Elsevier

## 2016-01-09 NOTE — Progress Notes (Signed)
Joshua Scott is a 81 y.o. male who presents to [facility] today for cough. Patient has a pertinent past medical history significant for moderate dementia and recent hospitalization with urosepsis.  He said a few days history of cough. He has multiple positive sick contacts at home with viral URIs. His wife is been recently hospitalized for pneumonia. He seems to be at baseline per his granddaughter who accompanies him today. He seems to be eating and drinking normally and sleeping normally. Aside from the cough he does not appear to be acutely ill per his granddaughter.   Past Medical History:  Diagnosis Date  . CAD (coronary artery disease)   . Diabetes mellitus   . ED (erectile dysfunction)   . Gout   . Hyperlipidemia   . Hypertension    Past Surgical History:  Procedure Laterality Date  . CATARACT EXTRACTION W/ INTRAOCULAR LENS IMPLANT  06/14/11   Left eye  . CATARACT EXTRACTION W/ INTRAOCULAR LENS IMPLANT  07/03/11   Right eye  . LEFT HEART CATHETERIZATION WITH CORONARY ANGIOGRAM N/A 07/14/2013   Procedure: LEFT HEART CATHETERIZATION WITH CORONARY ANGIOGRAM;  Surgeon: Peter M Martinique, MD;  Location: [facility] CATH LAB;  Service: Cardiovascular;  Laterality: N/A;   Social History  Substance Use Topics  . Smoking status: Former Smoker    Quit date: 01/23/1984  . Smokeless tobacco: Not on file  . Alcohol use No   family history includes Diabetes in his mother.  ROS as above:  Medications: Current Outpatient Prescriptions  Medication Sig Dispense Refill  . allopurinol (ZYLOPRIM) 300 MG tablet TAKE 1 TABLET DAILY 90 tablet 1  . aspirin 325 MG tablet Take 325 mg by mouth daily.    Mariane Baumgarten Sodium (STOOL SOFTENER) 100 MG capsule Take 100 mg by mouth 2 (two) times daily.    Marland Kitchen donepezil (ARICEPT) 10 MG tablet Take 10 mg by mouth at bedtime.    Marland Kitchen doxazosin (CARDURA) 4 MG  tablet Take 1 tablet (4 mg total) by mouth at bedtime. 90 tablet 1  . glipiZIDE (GLIPIZIDE XL) 5 MG 24 hr tablet Take 1 tablet (5 mg total) by mouth daily with breakfast. 90 tablet 2  . sertraline (ZOLOFT) 100 MG tablet Take 1 tablet by mouth daily. Take 1 tab by mouth daily    . TOPROL XL 25 MG 24 hr tablet TAKE 1 TABLET DAILY 90 tablet 2  . traZODone (DESYREL) 50 MG tablet Take 0.5 tablets (25 mg total) by mouth at bedtime. 30 MINUTES BEFORE BED (Patient taking differently: Take 50 mg by mouth at bedtime. 30 MINUTES BEFORE BED) 30 tablet 0  . valsartan (DIOVAN) 320 MG tablet Take 1 tablet (320 mg total) by mouth daily. 90 tablet 1  . vitamin B-12 (CYANOCOBALAMIN) 1000 MCG tablet Take 1,000 mcg by mouth.     No current facility-administered medications for this visit.    Allergies  Allergen Reactions  . Ativan [Lorazepam] Other (See Comments) and Anaphylaxis    Agitatoin.   . Metformin Other (See Comments)    CKD 3 cuased worsening renal level.   . Haloperidol Other (See Comments)    unknown  . Metformin And Related Other (See Comments)    CKD 3 cuased worsening renal level.     Health Maintenance Health Maintenance  Topic Date Due  . OPHTHALMOLOGY EXAM  05/13/2015  . HEMOGLOBIN A1C  03/03/2016  . FOOT EXAM  08/31/2016  . TETANUS/TDAP  04/26/2022  . INFLUENZA VACCINE  Addressed  . ZOSTAVAX  Completed  . PNA vac Low Risk Adult  Completed     Exam:  BP 124/83   Pulse 94   Temp 98.3 F (36.8 C)   Resp 12   Wt 230 lb (104.3 kg)   SpO2 96%   BMI 28.75 kg/m   Gen: Well NAD Nontoxic appearing HEENT: EOMI,  MMM Lungs: Normal work of breathing. Coarse breath sounds present bilaterally Heart: RRR no MRG Abd: NABS, Soft. Nondistended, Nontender Exts: Brisk capillary refill, warm and well perfused.    Chest x-ray: No acute infiltrate seen on preliminary review Awaiting formal radiology review  No results found for this or any previous visit (from the past 72 hour(s)). Dg  Chest 2 View  Result Date: 01/09/2016 CLINICAL DATA:  Five days of cough. History of previous CVA, thoracic aortic aneurysm, former smoker. EXAM: CHEST  2 VIEW COMPARISON:  CT scan of the chest of April 27, 2015 FINDINGS: The lungs are adequately inflated and clear. The heart and pulmonary vascularity are normal. There is tortuosity of the descending thoracic aorta with mural calcification present. There is no pleural effusion. The bony thorax exhibits no acute abnormality. IMPRESSION: There is no acute cardiopulmonary abnormality. Thoracic aortic atherosclerosis. Electronically Signed   By: David  Martinique M.D.   On: 01/09/2016 16:55      Assessment and Plan: 81 y.o. male with cough. Likely viral. Chest x-ray is unremarkable. Plan for watchful waiting with prompt follow-up if worsening.  Additionally and requests referral to social work for potential help with personal care services.   Orders Placed This Encounter  Procedures  . DG Chest 2 View    Order Specific Question:   Reason for exam:    Answer:   Cough, assess intra-thoracic pathology    Order Specific Question:   Preferred imaging location?    Answer:   [facility]  . Ambulatory referral to Social Work    Referral Priority:   Routine    Referral Type:   Consultation    Referral Reason:   Specialty Services Required    Requested Specialty:   Licensed Clinical Social Worker    Number of Visits Requested:   1    Discussed warning signs or symptoms. Please see discharge instructions. Patient expresses understanding.

## 2016-01-10 ENCOUNTER — Encounter: Payer: Self-pay | Admitting: Family Medicine

## 2016-01-10 DIAGNOSIS — N10 Acute tubulo-interstitial nephritis: Secondary | ICD-10-CM | POA: Diagnosis not present

## 2016-01-10 DIAGNOSIS — I700 Atherosclerosis of aorta: Secondary | ICD-10-CM | POA: Insufficient documentation

## 2016-01-10 DIAGNOSIS — I129 Hypertensive chronic kidney disease with stage 1 through stage 4 chronic kidney disease, or unspecified chronic kidney disease: Secondary | ICD-10-CM | POA: Diagnosis not present

## 2016-01-10 DIAGNOSIS — E1122 Type 2 diabetes mellitus with diabetic chronic kidney disease: Secondary | ICD-10-CM | POA: Diagnosis not present

## 2016-01-10 DIAGNOSIS — F0281 Dementia in other diseases classified elsewhere with behavioral disturbance: Secondary | ICD-10-CM | POA: Diagnosis not present

## 2016-01-10 DIAGNOSIS — A419 Sepsis, unspecified organism: Secondary | ICD-10-CM | POA: Diagnosis not present

## 2016-01-10 DIAGNOSIS — G300 Alzheimer's disease with early onset: Secondary | ICD-10-CM | POA: Diagnosis not present

## 2016-01-17 DIAGNOSIS — A419 Sepsis, unspecified organism: Secondary | ICD-10-CM | POA: Diagnosis not present

## 2016-01-17 DIAGNOSIS — G300 Alzheimer's disease with early onset: Secondary | ICD-10-CM | POA: Diagnosis not present

## 2016-01-17 DIAGNOSIS — I129 Hypertensive chronic kidney disease with stage 1 through stage 4 chronic kidney disease, or unspecified chronic kidney disease: Secondary | ICD-10-CM | POA: Diagnosis not present

## 2016-01-17 DIAGNOSIS — E1122 Type 2 diabetes mellitus with diabetic chronic kidney disease: Secondary | ICD-10-CM | POA: Diagnosis not present

## 2016-01-17 DIAGNOSIS — F0281 Dementia in other diseases classified elsewhere with behavioral disturbance: Secondary | ICD-10-CM | POA: Diagnosis not present

## 2016-01-17 DIAGNOSIS — N10 Acute tubulo-interstitial nephritis: Secondary | ICD-10-CM | POA: Diagnosis not present

## 2016-01-20 DIAGNOSIS — I129 Hypertensive chronic kidney disease with stage 1 through stage 4 chronic kidney disease, or unspecified chronic kidney disease: Secondary | ICD-10-CM | POA: Diagnosis not present

## 2016-01-20 DIAGNOSIS — F0281 Dementia in other diseases classified elsewhere with behavioral disturbance: Secondary | ICD-10-CM | POA: Diagnosis not present

## 2016-01-20 DIAGNOSIS — E1122 Type 2 diabetes mellitus with diabetic chronic kidney disease: Secondary | ICD-10-CM | POA: Diagnosis not present

## 2016-01-20 DIAGNOSIS — G300 Alzheimer's disease with early onset: Secondary | ICD-10-CM | POA: Diagnosis not present

## 2016-01-20 DIAGNOSIS — N10 Acute tubulo-interstitial nephritis: Secondary | ICD-10-CM | POA: Diagnosis not present

## 2016-01-20 DIAGNOSIS — A419 Sepsis, unspecified organism: Secondary | ICD-10-CM | POA: Diagnosis not present

## 2016-01-26 ENCOUNTER — Ambulatory Visit (INDEPENDENT_AMBULATORY_CARE_PROVIDER_SITE_OTHER): Payer: Medicare Other | Admitting: Family Medicine

## 2016-01-26 VITALS — BP 144/88 | HR 85 | Ht 75.0 in | Wt 235.0 lb

## 2016-01-26 DIAGNOSIS — F0390 Unspecified dementia without behavioral disturbance: Secondary | ICD-10-CM | POA: Diagnosis not present

## 2016-01-26 DIAGNOSIS — N183 Chronic kidney disease, stage 3 unspecified: Secondary | ICD-10-CM

## 2016-01-26 DIAGNOSIS — E0822 Diabetes mellitus due to underlying condition with diabetic chronic kidney disease: Secondary | ICD-10-CM

## 2016-01-26 DIAGNOSIS — R05 Cough: Secondary | ICD-10-CM | POA: Diagnosis not present

## 2016-01-26 DIAGNOSIS — F03B0 Unspecified dementia, moderate, without behavioral disturbance, psychotic disturbance, mood disturbance, and anxiety: Secondary | ICD-10-CM

## 2016-01-26 DIAGNOSIS — R059 Cough, unspecified: Secondary | ICD-10-CM

## 2016-01-26 LAB — POCT GLYCOSYLATED HEMOGLOBIN (HGB A1C): Hemoglobin A1C: 6.3

## 2016-01-26 NOTE — Progress Notes (Signed)
Subjective:    CC: DM  HPI: Diabetes - no hypoglycemic events. No wounds or sores that are not healing well. No increased thirst or urination. Checking glucose at home. Taking medications as prescribed without any side effects. The requesting referral for podiatry.  Cough - he still has a persistent cough. He was seen in our office about 3 weeks ago for hospital follow-up and did complain of a dry cough. He says he still has it. His son who is here with him today says it actually has been gradually getting better. No fevers or chills. No sore throat. He denies any lower extremity swelling.  Dementia-when he came in last time there was referral placed for social worker to see if he might be able to apply for Medicaid in addition to his Medicare. In progress mother come supportive services. His wife is his primary caretaker and she is quite frail herself. His son is here with him today. He says that he has never heard from them. Though there is some concern that he may not be taking messages and answering his phone.  Past medical history, Surgical history, Family history not pertinant except as noted below, Social history, Allergies, and medications have been entered into the medical record, reviewed, and corrections made.   Review of Systems: No fevers, chills, night sweats, weight loss, chest pain, or shortness of breath.   Objective:    General: Well Developed, well nourished, and in no acute distress.  Neuro: Alert and oriented x3, extra-ocular muscles intact, sensation grossly intact.  HEENT: Normocephalic, atraumatic  Skin: Warm and dry, no rashes. Cardiac: Regular rate and rhythm, no murmurs rubs or gallops, no lower extremity edema.  Respiratory: Clear to auscultation bilaterally. Not using accessory muscles, speaking in full sentences.   Impression and Recommendations:   DM- controlled. Hemoccult A1c of 6.3 today. Continue current regimen. Next  Cough-does seem to be improving on  its own. We'll continue to monitor. Chest exam is clear today and he did have a normal chest x-ray about 3 weeks ago.  Dementia-we'll try to work on getting a Education officer, museum to his home. He did receive some physical therapy services through [facility] right after his hospitalization so we may be of a contacted them to request a Education officer, museum consult.

## 2016-01-27 ENCOUNTER — Encounter: Payer: Self-pay | Admitting: Family Medicine

## 2016-01-29 DIAGNOSIS — Z9181 History of falling: Secondary | ICD-10-CM | POA: Diagnosis not present

## 2016-01-29 DIAGNOSIS — Z87891 Personal history of nicotine dependence: Secondary | ICD-10-CM | POA: Diagnosis not present

## 2016-01-29 DIAGNOSIS — E785 Hyperlipidemia, unspecified: Secondary | ICD-10-CM | POA: Diagnosis not present

## 2016-01-29 DIAGNOSIS — I129 Hypertensive chronic kidney disease with stage 1 through stage 4 chronic kidney disease, or unspecified chronic kidney disease: Secondary | ICD-10-CM | POA: Diagnosis not present

## 2016-01-29 DIAGNOSIS — N400 Enlarged prostate without lower urinary tract symptoms: Secondary | ICD-10-CM | POA: Diagnosis not present

## 2016-01-29 DIAGNOSIS — M109 Gout, unspecified: Secondary | ICD-10-CM | POA: Diagnosis not present

## 2016-01-29 DIAGNOSIS — I2510 Atherosclerotic heart disease of native coronary artery without angina pectoris: Secondary | ICD-10-CM | POA: Diagnosis not present

## 2016-01-29 DIAGNOSIS — N183 Chronic kidney disease, stage 3 (moderate): Secondary | ICD-10-CM | POA: Diagnosis not present

## 2016-01-29 DIAGNOSIS — Z8673 Personal history of transient ischemic attack (TIA), and cerebral infarction without residual deficits: Secondary | ICD-10-CM | POA: Diagnosis not present

## 2016-01-29 DIAGNOSIS — Z7982 Long term (current) use of aspirin: Secondary | ICD-10-CM | POA: Diagnosis not present

## 2016-01-29 DIAGNOSIS — I712 Thoracic aortic aneurysm, without rupture: Secondary | ICD-10-CM | POA: Diagnosis not present

## 2016-01-29 DIAGNOSIS — E1122 Type 2 diabetes mellitus with diabetic chronic kidney disease: Secondary | ICD-10-CM | POA: Diagnosis not present

## 2016-01-29 DIAGNOSIS — F0390 Unspecified dementia without behavioral disturbance: Secondary | ICD-10-CM | POA: Diagnosis not present

## 2016-01-29 DIAGNOSIS — I700 Atherosclerosis of aorta: Secondary | ICD-10-CM | POA: Diagnosis not present

## 2016-01-29 DIAGNOSIS — Z7984 Long term (current) use of oral hypoglycemic drugs: Secondary | ICD-10-CM | POA: Diagnosis not present

## 2016-01-30 DIAGNOSIS — I2510 Atherosclerotic heart disease of native coronary artery without angina pectoris: Secondary | ICD-10-CM | POA: Diagnosis not present

## 2016-01-30 DIAGNOSIS — E1122 Type 2 diabetes mellitus with diabetic chronic kidney disease: Secondary | ICD-10-CM | POA: Diagnosis not present

## 2016-01-30 DIAGNOSIS — M109 Gout, unspecified: Secondary | ICD-10-CM | POA: Diagnosis not present

## 2016-01-30 DIAGNOSIS — I129 Hypertensive chronic kidney disease with stage 1 through stage 4 chronic kidney disease, or unspecified chronic kidney disease: Secondary | ICD-10-CM | POA: Diagnosis not present

## 2016-01-30 DIAGNOSIS — N183 Chronic kidney disease, stage 3 (moderate): Secondary | ICD-10-CM | POA: Diagnosis not present

## 2016-01-30 DIAGNOSIS — F0390 Unspecified dementia without behavioral disturbance: Secondary | ICD-10-CM | POA: Diagnosis not present

## 2016-02-01 DIAGNOSIS — I129 Hypertensive chronic kidney disease with stage 1 through stage 4 chronic kidney disease, or unspecified chronic kidney disease: Secondary | ICD-10-CM | POA: Diagnosis not present

## 2016-02-01 DIAGNOSIS — F0390 Unspecified dementia without behavioral disturbance: Secondary | ICD-10-CM | POA: Diagnosis not present

## 2016-02-01 DIAGNOSIS — N183 Chronic kidney disease, stage 3 (moderate): Secondary | ICD-10-CM | POA: Diagnosis not present

## 2016-02-01 DIAGNOSIS — E1122 Type 2 diabetes mellitus with diabetic chronic kidney disease: Secondary | ICD-10-CM | POA: Diagnosis not present

## 2016-02-01 DIAGNOSIS — M109 Gout, unspecified: Secondary | ICD-10-CM | POA: Diagnosis not present

## 2016-02-01 DIAGNOSIS — I2510 Atherosclerotic heart disease of native coronary artery without angina pectoris: Secondary | ICD-10-CM | POA: Diagnosis not present

## 2016-02-07 DIAGNOSIS — N183 Chronic kidney disease, stage 3 (moderate): Secondary | ICD-10-CM | POA: Diagnosis not present

## 2016-02-07 DIAGNOSIS — E1122 Type 2 diabetes mellitus with diabetic chronic kidney disease: Secondary | ICD-10-CM | POA: Diagnosis not present

## 2016-02-07 DIAGNOSIS — I2510 Atherosclerotic heart disease of native coronary artery without angina pectoris: Secondary | ICD-10-CM | POA: Diagnosis not present

## 2016-02-07 DIAGNOSIS — F0390 Unspecified dementia without behavioral disturbance: Secondary | ICD-10-CM | POA: Diagnosis not present

## 2016-02-07 DIAGNOSIS — M109 Gout, unspecified: Secondary | ICD-10-CM | POA: Diagnosis not present

## 2016-02-07 DIAGNOSIS — I129 Hypertensive chronic kidney disease with stage 1 through stage 4 chronic kidney disease, or unspecified chronic kidney disease: Secondary | ICD-10-CM | POA: Diagnosis not present

## 2016-02-11 DIAGNOSIS — M109 Gout, unspecified: Secondary | ICD-10-CM | POA: Diagnosis not present

## 2016-02-11 DIAGNOSIS — I2510 Atherosclerotic heart disease of native coronary artery without angina pectoris: Secondary | ICD-10-CM | POA: Diagnosis not present

## 2016-02-11 DIAGNOSIS — I129 Hypertensive chronic kidney disease with stage 1 through stage 4 chronic kidney disease, or unspecified chronic kidney disease: Secondary | ICD-10-CM | POA: Diagnosis not present

## 2016-02-11 DIAGNOSIS — E1122 Type 2 diabetes mellitus with diabetic chronic kidney disease: Secondary | ICD-10-CM | POA: Diagnosis not present

## 2016-02-11 DIAGNOSIS — N183 Chronic kidney disease, stage 3 (moderate): Secondary | ICD-10-CM | POA: Diagnosis not present

## 2016-02-11 DIAGNOSIS — F0390 Unspecified dementia without behavioral disturbance: Secondary | ICD-10-CM | POA: Diagnosis not present

## 2016-02-15 DIAGNOSIS — E1122 Type 2 diabetes mellitus with diabetic chronic kidney disease: Secondary | ICD-10-CM | POA: Diagnosis not present

## 2016-02-15 DIAGNOSIS — F0390 Unspecified dementia without behavioral disturbance: Secondary | ICD-10-CM | POA: Diagnosis not present

## 2016-02-15 DIAGNOSIS — I129 Hypertensive chronic kidney disease with stage 1 through stage 4 chronic kidney disease, or unspecified chronic kidney disease: Secondary | ICD-10-CM | POA: Diagnosis not present

## 2016-02-15 DIAGNOSIS — I2510 Atherosclerotic heart disease of native coronary artery without angina pectoris: Secondary | ICD-10-CM | POA: Diagnosis not present

## 2016-02-15 DIAGNOSIS — N183 Chronic kidney disease, stage 3 (moderate): Secondary | ICD-10-CM | POA: Diagnosis not present

## 2016-02-15 DIAGNOSIS — M109 Gout, unspecified: Secondary | ICD-10-CM | POA: Diagnosis not present

## 2016-02-21 DIAGNOSIS — N183 Chronic kidney disease, stage 3 (moderate): Secondary | ICD-10-CM | POA: Diagnosis not present

## 2016-02-21 DIAGNOSIS — I129 Hypertensive chronic kidney disease with stage 1 through stage 4 chronic kidney disease, or unspecified chronic kidney disease: Secondary | ICD-10-CM | POA: Diagnosis not present

## 2016-02-21 DIAGNOSIS — F0390 Unspecified dementia without behavioral disturbance: Secondary | ICD-10-CM | POA: Diagnosis not present

## 2016-02-21 DIAGNOSIS — E1122 Type 2 diabetes mellitus with diabetic chronic kidney disease: Secondary | ICD-10-CM | POA: Diagnosis not present

## 2016-03-05 ENCOUNTER — Other Ambulatory Visit: Payer: Self-pay | Admitting: *Deleted

## 2016-03-05 DIAGNOSIS — I10 Essential (primary) hypertension: Secondary | ICD-10-CM

## 2016-03-05 MED ORDER — VALSARTAN 320 MG PO TABS: 320.0000 mg | ORAL_TABLET | Freq: Every day | ORAL | 1 refills | Status: DC

## 2016-03-05 MED ORDER — SERTRALINE HCL 100 MG PO TABS: 100.0000 mg | ORAL_TABLET | Freq: Every day | ORAL | 1 refills | Status: DC

## 2016-03-05 MED ORDER — DOXAZOSIN MESYLATE 4 MG PO TABS: 4.0000 mg | ORAL_TABLET | Freq: Every day | ORAL | 1 refills | Status: DC

## 2016-03-06 DIAGNOSIS — R410 Disorientation, unspecified: Secondary | ICD-10-CM | POA: Diagnosis not present

## 2016-03-06 DIAGNOSIS — R509 Fever, unspecified: Secondary | ICD-10-CM | POA: Diagnosis not present

## 2016-03-06 DIAGNOSIS — R1013 Epigastric pain: Secondary | ICD-10-CM | POA: Diagnosis not present

## 2016-03-06 DIAGNOSIS — G300 Alzheimer's disease with early onset: Secondary | ICD-10-CM | POA: Diagnosis not present

## 2016-03-06 DIAGNOSIS — F0281 Dementia in other diseases classified elsewhere with behavioral disturbance: Secondary | ICD-10-CM | POA: Diagnosis not present

## 2016-03-06 DIAGNOSIS — S79911A Unspecified injury of right hip, initial encounter: Secondary | ICD-10-CM | POA: Diagnosis not present

## 2016-03-06 DIAGNOSIS — K529 Noninfective gastroenteritis and colitis, unspecified: Secondary | ICD-10-CM | POA: Diagnosis not present

## 2016-03-06 DIAGNOSIS — A0472 Enterocolitis due to Clostridium difficile, not specified as recurrent: Secondary | ICD-10-CM | POA: Diagnosis not present

## 2016-03-06 DIAGNOSIS — S3993XA Unspecified injury of pelvis, initial encounter: Secondary | ICD-10-CM | POA: Diagnosis not present

## 2016-03-06 DIAGNOSIS — R000 Tachycardia, unspecified: Secondary | ICD-10-CM | POA: Diagnosis not present

## 2016-03-06 DIAGNOSIS — E1122 Type 2 diabetes mellitus with diabetic chronic kidney disease: Secondary | ICD-10-CM | POA: Diagnosis not present

## 2016-03-06 DIAGNOSIS — Q211 Atrial septal defect: Secondary | ICD-10-CM | POA: Diagnosis not present

## 2016-03-06 DIAGNOSIS — K5730 Diverticulosis of large intestine without perforation or abscess without bleeding: Secondary | ICD-10-CM | POA: Diagnosis not present

## 2016-03-06 DIAGNOSIS — N183 Chronic kidney disease, stage 3 (moderate): Secondary | ICD-10-CM | POA: Diagnosis not present

## 2016-03-06 DIAGNOSIS — K4020 Bilateral inguinal hernia, without obstruction or gangrene, not specified as recurrent: Secondary | ICD-10-CM | POA: Diagnosis not present

## 2016-03-06 DIAGNOSIS — S79912A Unspecified injury of left hip, initial encounter: Secondary | ICD-10-CM | POA: Diagnosis not present

## 2016-03-06 DIAGNOSIS — S0990XA Unspecified injury of head, initial encounter: Secondary | ICD-10-CM | POA: Diagnosis not present

## 2016-03-06 DIAGNOSIS — R531 Weakness: Secondary | ICD-10-CM | POA: Diagnosis not present

## 2016-03-07 DIAGNOSIS — K529 Noninfective gastroenteritis and colitis, unspecified: Secondary | ICD-10-CM | POA: Diagnosis not present

## 2016-03-07 DIAGNOSIS — Q248 Other specified congenital malformations of heart: Secondary | ICD-10-CM | POA: Diagnosis not present

## 2016-03-07 DIAGNOSIS — I129 Hypertensive chronic kidney disease with stage 1 through stage 4 chronic kidney disease, or unspecified chronic kidney disease: Secondary | ICD-10-CM | POA: Diagnosis not present

## 2016-03-07 DIAGNOSIS — N183 Chronic kidney disease, stage 3 (moderate): Secondary | ICD-10-CM | POA: Diagnosis not present

## 2016-03-07 DIAGNOSIS — G300 Alzheimer's disease with early onset: Secondary | ICD-10-CM | POA: Diagnosis not present

## 2016-03-07 DIAGNOSIS — F0280 Dementia in other diseases classified elsewhere without behavioral disturbance: Secondary | ICD-10-CM | POA: Diagnosis not present

## 2016-03-07 DIAGNOSIS — Q211 Atrial septal defect: Secondary | ICD-10-CM | POA: Diagnosis not present

## 2016-03-07 DIAGNOSIS — E1122 Type 2 diabetes mellitus with diabetic chronic kidney disease: Secondary | ICD-10-CM | POA: Diagnosis not present

## 2016-03-08 DIAGNOSIS — I129 Hypertensive chronic kidney disease with stage 1 through stage 4 chronic kidney disease, or unspecified chronic kidney disease: Secondary | ICD-10-CM | POA: Diagnosis present

## 2016-03-08 DIAGNOSIS — N183 Chronic kidney disease, stage 3 (moderate): Secondary | ICD-10-CM | POA: Diagnosis present

## 2016-03-08 DIAGNOSIS — Q211 Atrial septal defect: Secondary | ICD-10-CM | POA: Diagnosis not present

## 2016-03-08 DIAGNOSIS — A0472 Enterocolitis due to Clostridium difficile, not specified as recurrent: Secondary | ICD-10-CM | POA: Diagnosis present

## 2016-03-08 DIAGNOSIS — R509 Fever, unspecified: Secondary | ICD-10-CM | POA: Diagnosis not present

## 2016-03-08 DIAGNOSIS — R410 Disorientation, unspecified: Secondary | ICD-10-CM | POA: Diagnosis not present

## 2016-03-08 DIAGNOSIS — Z9181 History of falling: Secondary | ICD-10-CM | POA: Diagnosis not present

## 2016-03-08 DIAGNOSIS — E119 Type 2 diabetes mellitus without complications: Secondary | ICD-10-CM | POA: Diagnosis not present

## 2016-03-08 DIAGNOSIS — F0281 Dementia in other diseases classified elsewhere with behavioral disturbance: Secondary | ICD-10-CM | POA: Diagnosis not present

## 2016-03-08 DIAGNOSIS — Z888 Allergy status to other drugs, medicaments and biological substances status: Secondary | ICD-10-CM | POA: Diagnosis not present

## 2016-03-08 DIAGNOSIS — Z79899 Other long term (current) drug therapy: Secondary | ICD-10-CM | POA: Diagnosis not present

## 2016-03-08 DIAGNOSIS — A0811 Acute gastroenteropathy due to Norwalk agent: Secondary | ICD-10-CM | POA: Diagnosis not present

## 2016-03-08 DIAGNOSIS — G300 Alzheimer's disease with early onset: Secondary | ICD-10-CM | POA: Diagnosis not present

## 2016-03-08 DIAGNOSIS — Z9183 Wandering in diseases classified elsewhere: Secondary | ICD-10-CM | POA: Diagnosis not present

## 2016-03-08 DIAGNOSIS — K529 Noninfective gastroenteritis and colitis, unspecified: Secondary | ICD-10-CM | POA: Diagnosis not present

## 2016-03-08 DIAGNOSIS — Z886 Allergy status to analgesic agent status: Secondary | ICD-10-CM | POA: Diagnosis not present

## 2016-03-08 DIAGNOSIS — E1122 Type 2 diabetes mellitus with diabetic chronic kidney disease: Secondary | ICD-10-CM | POA: Diagnosis present

## 2016-03-08 DIAGNOSIS — Z8673 Personal history of transient ischemic attack (TIA), and cerebral infarction without residual deficits: Secondary | ICD-10-CM | POA: Diagnosis not present

## 2016-03-08 DIAGNOSIS — I2510 Atherosclerotic heart disease of native coronary artery without angina pectoris: Secondary | ICD-10-CM | POA: Diagnosis present

## 2016-03-08 DIAGNOSIS — R531 Weakness: Secondary | ICD-10-CM | POA: Diagnosis not present

## 2016-03-08 DIAGNOSIS — Z7982 Long term (current) use of aspirin: Secondary | ICD-10-CM | POA: Diagnosis not present

## 2016-03-16 DIAGNOSIS — E1122 Type 2 diabetes mellitus with diabetic chronic kidney disease: Secondary | ICD-10-CM | POA: Diagnosis not present

## 2016-03-16 DIAGNOSIS — I2510 Atherosclerotic heart disease of native coronary artery without angina pectoris: Secondary | ICD-10-CM | POA: Diagnosis not present

## 2016-03-16 DIAGNOSIS — N183 Chronic kidney disease, stage 3 (moderate): Secondary | ICD-10-CM | POA: Diagnosis not present

## 2016-03-16 DIAGNOSIS — I129 Hypertensive chronic kidney disease with stage 1 through stage 4 chronic kidney disease, or unspecified chronic kidney disease: Secondary | ICD-10-CM | POA: Diagnosis not present

## 2016-03-16 DIAGNOSIS — F0390 Unspecified dementia without behavioral disturbance: Secondary | ICD-10-CM | POA: Diagnosis not present

## 2016-03-16 DIAGNOSIS — M109 Gout, unspecified: Secondary | ICD-10-CM | POA: Diagnosis not present

## 2016-03-19 DIAGNOSIS — I2510 Atherosclerotic heart disease of native coronary artery without angina pectoris: Secondary | ICD-10-CM | POA: Diagnosis not present

## 2016-03-19 DIAGNOSIS — N183 Chronic kidney disease, stage 3 (moderate): Secondary | ICD-10-CM | POA: Diagnosis not present

## 2016-03-19 DIAGNOSIS — F0390 Unspecified dementia without behavioral disturbance: Secondary | ICD-10-CM | POA: Diagnosis not present

## 2016-03-19 DIAGNOSIS — M109 Gout, unspecified: Secondary | ICD-10-CM | POA: Diagnosis not present

## 2016-03-19 DIAGNOSIS — I129 Hypertensive chronic kidney disease with stage 1 through stage 4 chronic kidney disease, or unspecified chronic kidney disease: Secondary | ICD-10-CM | POA: Diagnosis not present

## 2016-03-19 DIAGNOSIS — E1122 Type 2 diabetes mellitus with diabetic chronic kidney disease: Secondary | ICD-10-CM | POA: Diagnosis not present

## 2016-03-20 ENCOUNTER — Ambulatory Visit (INDEPENDENT_AMBULATORY_CARE_PROVIDER_SITE_OTHER): Payer: Medicare Other | Admitting: Family Medicine

## 2016-03-20 ENCOUNTER — Encounter: Payer: Self-pay | Admitting: Family Medicine

## 2016-03-20 VITALS — BP 134/81 | HR 102 | Ht 75.0 in | Wt 237.0 lb

## 2016-03-20 DIAGNOSIS — F0390 Unspecified dementia without behavioral disturbance: Secondary | ICD-10-CM | POA: Diagnosis not present

## 2016-03-20 DIAGNOSIS — N183 Chronic kidney disease, stage 3 unspecified: Secondary | ICD-10-CM

## 2016-03-20 DIAGNOSIS — A080 Rotaviral enteritis: Secondary | ICD-10-CM | POA: Diagnosis not present

## 2016-03-20 DIAGNOSIS — I10 Essential (primary) hypertension: Secondary | ICD-10-CM | POA: Diagnosis not present

## 2016-03-20 DIAGNOSIS — R059 Cough, unspecified: Secondary | ICD-10-CM

## 2016-03-20 DIAGNOSIS — R05 Cough: Secondary | ICD-10-CM

## 2016-03-20 DIAGNOSIS — A0472 Enterocolitis due to Clostridium difficile, not specified as recurrent: Secondary | ICD-10-CM

## 2016-03-20 DIAGNOSIS — F03B0 Unspecified dementia, moderate, without behavioral disturbance, psychotic disturbance, mood disturbance, and anxiety: Secondary | ICD-10-CM

## 2016-03-20 NOTE — Progress Notes (Signed)
Subjective:    CC: Hospital f/u.   HPI:  82 year old male comes in today for hospital follow-up. He has history of dementia CKD and diabetes.Joshua Scott He presented to the emergency department with nausea vomiting and diarrhea as well as some multiple falls at home. He actually had a temperature of 101.5. Though he did test negative for influenza. He was admitted on February 13. He was discharged home on every 20th with the hospital length of stay is 7 days. While there his GI panel came back positive for rotavirus and C. difficile. He was placed on vancomycin and his diet was advanced as tolerated. He was discharged home with family as well as home nursing through home health. He was discharged home on vancomycin. He was actually complete this dosing today.  Hypertension- Pt denies chest pain, SOB, dizziness, or heart palpitations.  Taking meds as directed w/o problems.  Denies medication side effects.    His granddaughter is here with him today for the office visit.  Granddaughter also wanting to check his chest today. She said he's had a mild cough with some congestion sounds in the chest for about 2 days. No fevers chills or sweats. No shortness of breath.  Past medical history, Surgical history, Family history not pertinant except as noted below, Social history, Allergies, and medications have been entered into the medical record, reviewed, and corrections made.   Review of Systems: No fevers, chills, night sweats, weight loss, chest pain, or shortness of breath.   Objective:    General: Well Developed, well nourished, and in no acute distress.  Neuro: Alert and oriented x3, extra-ocular muscles intact, sensation grossly intact.  HEENT: Normocephalic, atraumatic, OP is clear, TMs and canals are clear bilat. No conjunctiviti.    Skin: Warm and dry, no rashes. Cardiac: Regular rate and rhythm, no murmurs rubs or gallops, no lower extremity edema.  Respiratory: Clear to auscultation bilaterally.  Not using accessory muscles, speaking in full sentences.   Impression and Recommendations:    Rotavirus-Resolved.  C. difficile-encouraged him to complete antibiotic. Call if recurrence of any symptoms.  Hypertension-well-controlled. Continue current regimen.  CKD-stable. Due to recheck labs.  Diabetes type 2-keep follow-up in April.  On an ARB.   Dementia - MMSE score of 13 today.  ON aricept.  Follows at [facility].   Cough-exam is clear chest today. Continue to monitor for worsening symptoms, sputum production or fever.

## 2016-03-22 DIAGNOSIS — I129 Hypertensive chronic kidney disease with stage 1 through stage 4 chronic kidney disease, or unspecified chronic kidney disease: Secondary | ICD-10-CM | POA: Diagnosis not present

## 2016-03-22 DIAGNOSIS — E1122 Type 2 diabetes mellitus with diabetic chronic kidney disease: Secondary | ICD-10-CM | POA: Diagnosis not present

## 2016-03-22 DIAGNOSIS — N183 Chronic kidney disease, stage 3 (moderate): Secondary | ICD-10-CM | POA: Diagnosis not present

## 2016-03-22 DIAGNOSIS — F0390 Unspecified dementia without behavioral disturbance: Secondary | ICD-10-CM | POA: Diagnosis not present

## 2016-03-22 DIAGNOSIS — I2510 Atherosclerotic heart disease of native coronary artery without angina pectoris: Secondary | ICD-10-CM | POA: Diagnosis not present

## 2016-03-22 DIAGNOSIS — M109 Gout, unspecified: Secondary | ICD-10-CM | POA: Diagnosis not present

## 2016-03-26 DIAGNOSIS — E1122 Type 2 diabetes mellitus with diabetic chronic kidney disease: Secondary | ICD-10-CM | POA: Diagnosis not present

## 2016-03-26 DIAGNOSIS — F0390 Unspecified dementia without behavioral disturbance: Secondary | ICD-10-CM | POA: Diagnosis not present

## 2016-03-26 DIAGNOSIS — I129 Hypertensive chronic kidney disease with stage 1 through stage 4 chronic kidney disease, or unspecified chronic kidney disease: Secondary | ICD-10-CM | POA: Diagnosis not present

## 2016-03-26 DIAGNOSIS — I2510 Atherosclerotic heart disease of native coronary artery without angina pectoris: Secondary | ICD-10-CM | POA: Diagnosis not present

## 2016-03-26 DIAGNOSIS — M109 Gout, unspecified: Secondary | ICD-10-CM | POA: Diagnosis not present

## 2016-03-26 DIAGNOSIS — N183 Chronic kidney disease, stage 3 (moderate): Secondary | ICD-10-CM | POA: Diagnosis not present

## 2016-04-02 DIAGNOSIS — I129 Hypertensive chronic kidney disease with stage 1 through stage 4 chronic kidney disease, or unspecified chronic kidney disease: Secondary | ICD-10-CM | POA: Diagnosis not present

## 2016-04-02 DIAGNOSIS — N183 Chronic kidney disease, stage 3 (moderate): Secondary | ICD-10-CM | POA: Diagnosis not present

## 2016-04-02 DIAGNOSIS — F0390 Unspecified dementia without behavioral disturbance: Secondary | ICD-10-CM | POA: Diagnosis not present

## 2016-04-02 DIAGNOSIS — E1122 Type 2 diabetes mellitus with diabetic chronic kidney disease: Secondary | ICD-10-CM | POA: Diagnosis not present

## 2016-04-27 ENCOUNTER — Telehealth: Payer: Self-pay | Admitting: *Deleted

## 2016-04-27 DIAGNOSIS — I712 Thoracic aortic aneurysm, without rupture, unspecified: Secondary | ICD-10-CM

## 2016-04-27 DIAGNOSIS — I2510 Atherosclerotic heart disease of native coronary artery without angina pectoris: Secondary | ICD-10-CM

## 2016-04-27 NOTE — Telephone Encounter (Signed)
Spoke with pt wife, it is time for the CTA to follow up on patients thoracic aneurysm and make an aoppt for follow up with dr Stanford Breed. Pt is scheduled for CTA of chest on 05/08/16 @ 2pm at the [facility]. He know to have lab work drawn prior to the scan date at the [place] office. He will have liquids only 4 hours prior to the scan. Follow up scheduled with dr Bonner Puna for yearly visit.

## 2016-04-28 ENCOUNTER — Other Ambulatory Visit: Payer: Self-pay | Admitting: Cardiology

## 2016-04-28 ENCOUNTER — Other Ambulatory Visit: Payer: Self-pay | Admitting: Family Medicine

## 2016-05-01 DIAGNOSIS — I2510 Atherosclerotic heart disease of native coronary artery without angina pectoris: Secondary | ICD-10-CM | POA: Diagnosis not present

## 2016-05-02 LAB — BASIC METABOLIC PANEL
BUN: 19 mg/dL (ref 7–25)
CALCIUM: 8.9 mg/dL (ref 8.6–10.3)
CHLORIDE: 103 mmol/L (ref 98–110)
CO2: 25 mmol/L (ref 20–31)
Creat: 1.26 mg/dL — ABNORMAL HIGH (ref 0.70–1.18)
GLUCOSE: 124 mg/dL — AB (ref 65–99)
Potassium: 4.1 mmol/L (ref 3.5–5.3)
SODIUM: 139 mmol/L (ref 135–146)

## 2016-05-08 ENCOUNTER — Ambulatory Visit (HOSPITAL_BASED_OUTPATIENT_CLINIC_OR_DEPARTMENT_OTHER)
Admission: RE | Admit: 2016-05-08 | Discharge: 2016-05-08 | Disposition: A | Discharge status: Home / Self Care | Payer: Medicare Other | Source: Ambulatory Visit | Attending: Cardiology | Admitting: Cardiology

## 2016-05-08 ENCOUNTER — Encounter (HOSPITAL_BASED_OUTPATIENT_CLINIC_OR_DEPARTMENT_OTHER): Payer: Self-pay

## 2016-05-08 DIAGNOSIS — I712 Thoracic aortic aneurysm, without rupture, unspecified: Secondary | ICD-10-CM

## 2016-05-08 DIAGNOSIS — I2510 Atherosclerotic heart disease of native coronary artery without angina pectoris: Secondary | ICD-10-CM | POA: Diagnosis not present

## 2016-05-08 MED ORDER — IOPAMIDOL (ISOVUE-370) INJECTION 76%
100.0000 mL | Freq: Once | INTRAVENOUS | Status: AC | PRN
  Administered 2016-05-08: 100 mL via INTRAVENOUS

## 2016-05-09 ENCOUNTER — Telehealth: Payer: Self-pay

## 2016-05-09 NOTE — Telephone Encounter (Signed)
Who is Dr. Loa Socks?  Maybe he needs to refill it if he has been the one writing it.  Just let me know Pluse need to verify dose.

## 2016-05-09 NOTE — Telephone Encounter (Signed)
Pt's wife called and stated that the trazodone did not come [facility].  [facility] and they said it was last ordered by Dr Loa Socks.  Please advise about reordering medication, and if it is reordered, it will need to be sent to [facility] as he only has 1 left.

## 2016-05-10 MED ORDER — TRAZODONE HCL 50 MG PO TABS: 25.0000 mg | ORAL_TABLET | Freq: Every day | ORAL | 1 refills | Status: DC

## 2016-05-10 NOTE — Telephone Encounter (Signed)
Called and spoke with Pt's wife, was advised Dr. Loa Socks was Pt's former PCP. States he was taking 0.5 tab (25mg ) of Trazodone at night before bed. Routing back to PCP for review.

## 2016-05-10 NOTE — Telephone Encounter (Signed)
Okay, no problem.  New prescription sent to [facility]. 

## 2016-05-11 NOTE — Telephone Encounter (Signed)
Pt's wife advised.

## 2016-05-22 ENCOUNTER — Encounter: Payer: Self-pay | Admitting: Family Medicine

## 2016-05-22 ENCOUNTER — Ambulatory Visit (INDEPENDENT_AMBULATORY_CARE_PROVIDER_SITE_OTHER): Payer: Medicare Other | Admitting: Family Medicine

## 2016-05-22 VITALS — BP 121/82 | HR 93 | Ht 75.0 in | Wt 244.0 lb

## 2016-05-22 DIAGNOSIS — I2510 Atherosclerotic heart disease of native coronary artery without angina pectoris: Secondary | ICD-10-CM | POA: Diagnosis not present

## 2016-05-22 DIAGNOSIS — M25472 Effusion, left ankle: Secondary | ICD-10-CM

## 2016-05-22 DIAGNOSIS — G4700 Insomnia, unspecified: Secondary | ICD-10-CM | POA: Diagnosis not present

## 2016-05-22 DIAGNOSIS — N183 Chronic kidney disease, stage 3 (moderate): Secondary | ICD-10-CM

## 2016-05-22 DIAGNOSIS — E0822 Diabetes mellitus due to underlying condition with diabetic chronic kidney disease: Secondary | ICD-10-CM | POA: Diagnosis not present

## 2016-05-22 LAB — POCT GLYCOSYLATED HEMOGLOBIN (HGB A1C): Hemoglobin A1C: 6.2

## 2016-05-22 MED ORDER — TRAZODONE HCL 50 MG PO TABS: 25.0000 mg | ORAL_TABLET | Freq: Every day | ORAL | 1 refills | Status: DC

## 2016-05-22 NOTE — Progress Notes (Signed)
Subjective:    CC:DM  HPI:  Bilat ankle swelling for several years. More on the left  No recent change.  No sure what may have triggered it.  No pain in the leg or calf.  NO CP or SOB.    Diabetes - no hypoglycemic events. No wounds or sores that are not healing well. No increased thirst or urination. Checking glucose at home. Taking medications as prescribed without any side effects. His weight is up 7 lbs.      Insomnia-uses trazodone at bedtime. Takes anywhere between half tablet to a whole tab.  Past medical history, Surgical history, Family history not pertinant except as noted below, Social history, Allergies, and medications have been entered into the medical record, reviewed, and corrections made.   Review of Systems: No fevers, chills, night sweats, weight loss, chest pain, or shortness of breath.   Objective:    General: Well Developed, well nourished, and in no acute distress.  Neuro: Alert and oriented x3, extra-ocular muscles intact, sensation grossly intact.  HEENT: Normocephalic, atraumatic  Skin: Warm and dry, no rashes. Cardiac: Regular rate and rhythm, no murmurs rubs or gallops, no lower extremity edema.  Respiratory: Clear to auscultation bilaterally. Not using accessory muscles, speaking in full sentences. EXT: He does have some trace pitting edema right around the ankle on the left foot. Ankle with normal range of motion. No pain with dorsiflexion. tenderness over the calf. left ankle with no swelling. dorsal pedal pulse 2+. he had a few excoriations over the left lower leg which she says was from a tree branch.   Impression and Recommendations:    DM- Well controlled. Continue current regimen. Follow up in  3-4 months. A1C of 6.2.  Patient also requests a podiatry referral for nail care. Referral placed today. Lab work is up-to-date. He does take an aspirin daily.  Ankle edema - times like this is chronic and not new. No sign of DVT on exam. We'll continue to  monitor. No sign of heart failure.  Insomnia-refilled trazodone 3 mail order.

## 2016-05-28 NOTE — Progress Notes (Signed)
HPI: FU CAD. Nuclear study in April of 2015 showed an ejection fraction of 57%. There was an inferior lateral defect concerning for prior infarction with mild peri-infarct ischemia. Cardiac catheterization in June 2015 showed mild irregularities in the left main, 30% LAD, 60-70% proximal circumflex, mid 50% and a 95-99% first obtuse marginal. There was a 70-80% right coronary artery. Medical therapy recommended. Had CVA November 2016. Admitted in [place]. Echocardiogram showed normal LV function, mildly dilated aortic root, moderately dilated sinus of Valsalva and PFO. Carotid Doppler showed no obstruction. CTA 4/18 showed coronary calcification, TAA 4.4 cm. Since last seen, patient denies dyspnea, chest pain, palpitations or syncope. His dementia is apparently worsening.  Current Outpatient Prescriptions  Medication Sig Dispense Refill  . allopurinol (ZYLOPRIM) 300 MG tablet TAKE 1 TABLET DAILY 90 tablet 1  . aspirin 325 MG tablet Take 325 mg by mouth daily.    Mariane Baumgarten Sodium (STOOL SOFTENER) 100 MG capsule Take 100 mg by mouth 2 (two) times daily.    Marland Kitchen donepezil (ARICEPT) 10 MG tablet Take 10 mg by mouth at bedtime.    Marland Kitchen doxazosin (CARDURA) 4 MG tablet Take 1 tablet (4 mg total) by mouth at bedtime. 90 tablet 1  . glipiZIDE (GLIPIZIDE XL) 5 MG 24 hr tablet Take 1 tablet (5 mg total) by mouth daily with breakfast. 90 tablet 2  . sertraline (ZOLOFT) 100 MG tablet Take 1 tablet (100 mg total) by mouth daily. Take 1 tab by mouth daily 90 tablet 1  . TOPROL XL 25 MG 24 hr tablet TAKE 1 TABLET DAILY 90 tablet 0  . traZODone (DESYREL) 50 MG tablet Take 0.5 tablets (25 mg total) by mouth at bedtime. 30 MINUTES BEFORE BED 90 tablet 1  . valsartan (DIOVAN) 320 MG tablet Take 1 tablet (320 mg total) by mouth daily. 90 tablet 1  . vitamin B-12 (CYANOCOBALAMIN) 1000 MCG tablet Take 1,000 mcg by mouth.     No current facility-administered medications for this visit.      Past Medical  History:  Diagnosis Date  . CAD (coronary artery disease)   . Diabetes mellitus   . ED (erectile dysfunction)   . Gout   . Hyperlipidemia   . Hypertension     Past Surgical History:  Procedure Laterality Date  . CATARACT EXTRACTION W/ INTRAOCULAR LENS IMPLANT  06/14/11   Left eye  . CATARACT EXTRACTION W/ INTRAOCULAR LENS IMPLANT  07/03/11   Right eye  . LEFT HEART CATHETERIZATION WITH CORONARY ANGIOGRAM N/A 07/14/2013   Procedure: LEFT HEART CATHETERIZATION WITH CORONARY ANGIOGRAM;  Surgeon: Peter M Martinique, MD;  Location: [facility] CATH LAB;  Service: Cardiovascular;  Laterality: N/A;    Social History   Social History  . Marital status: Married    Spouse name: N/A  . Number of children: 3  . Years of education: N/A   Occupational History  .      Retired from [employer]  . Smoking status: Former Smoker    Quit date: 01/23/1984  . Smokeless tobacco: Never Used  . Alcohol use No  . Drug use: No  . Sexual activity: Not on file   Other Topics Concern  . Not on file   Social History Narrative  . No narrative on file    Family History  Problem Relation Age of Onset  . Diabetes Mother     ROS: no fevers or chills, productive cough, hemoptysis, dysphasia, odynophagia, melena, hematochezia,  dysuria, hematuria, rash, seizure activity, orthopnea, PND, pedal edema, claudication. Remaining systems are negative.  Physical Exam: Well-developed elderly in no acute distress.  Skin is warm and dry.  HEENT is normal.  Neck is supple. No bruit Chest is clear to auscultation with normal expansion.  Cardiovascular exam is regular rate and rhythm.  Abdominal exam nontender or distended. No masses palpated. Extremities show no edema. neuro grossly intact, evidence of some dementia.  ECG- Sinus rhythm at a rate of 98. Cannot rule out prior inferior infarct. First-degree AV block. personally reviewed  A/P  1 Thoracic aortic aneurysm-I discussed his aneurysm  with patient and his son today. His dementia is worsening and he requires a Psychologist, counselling during the day. I doubt he would be a good candidate for consideration of aortic root replacement and therefore it is not clear to me that we should continue performing CTAs to follow aortic root. Patient's son understands. When I see him back in one year we will decide if we need to repeat study.  2 hyperlipidemia-continue diet. Statin discontinued previously as it was felt to potentially be contributing to memory issues.  3 hypertension-blood pressure elevated. Patient's son states typically controlled at home. They will follow and we will advance regimen as needed.  4 Prior CVA-continue aspirin.  5 coronary artery disease-continue aspirin.  6 dementia-appears to be progressing. Management per primary care.   Kirk Ruths, MD

## 2016-05-30 ENCOUNTER — Encounter: Payer: Self-pay | Admitting: Cardiology

## 2016-05-30 ENCOUNTER — Ambulatory Visit (INDEPENDENT_AMBULATORY_CARE_PROVIDER_SITE_OTHER): Payer: Medicare Other | Admitting: Cardiology

## 2016-05-30 VITALS — BP 152/100 | HR 98 | Ht 75.0 in | Wt 227.1 lb

## 2016-05-30 DIAGNOSIS — I2510 Atherosclerotic heart disease of native coronary artery without angina pectoris: Secondary | ICD-10-CM

## 2016-05-30 DIAGNOSIS — I712 Thoracic aortic aneurysm, without rupture, unspecified: Secondary | ICD-10-CM

## 2016-05-30 DIAGNOSIS — I10 Essential (primary) hypertension: Secondary | ICD-10-CM | POA: Diagnosis not present

## 2016-05-30 NOTE — Patient Instructions (Signed)
Your physician wants you to follow-up in: ONE YEAR WITH DR CRENSHAW You will receive a reminder letter in the mail two months in advance. If you don't receive a letter, please call our office to schedule the follow-up appointment.   If you need a refill on your cardiac medications before your next appointment, please call your pharmacy.  

## 2016-06-04 ENCOUNTER — Ambulatory Visit (INDEPENDENT_AMBULATORY_CARE_PROVIDER_SITE_OTHER): Payer: Medicare Other | Admitting: Podiatry

## 2016-06-04 ENCOUNTER — Encounter: Payer: Self-pay | Admitting: Podiatry

## 2016-06-04 VITALS — BP 146/96 | HR 78 | Ht 75.0 in | Wt 227.0 lb

## 2016-06-04 DIAGNOSIS — E0842 Diabetes mellitus due to underlying condition with diabetic polyneuropathy: Secondary | ICD-10-CM | POA: Diagnosis not present

## 2016-06-04 DIAGNOSIS — B351 Tinea unguium: Secondary | ICD-10-CM | POA: Diagnosis not present

## 2016-06-04 DIAGNOSIS — I2510 Atherosclerotic heart disease of native coronary artery without angina pectoris: Secondary | ICD-10-CM | POA: Diagnosis not present

## 2016-06-04 NOTE — Patient Instructions (Signed)
Seen for hypertrophic nails. All nails debrided. Return in 3 months or as needed.  

## 2016-06-04 NOTE — Progress Notes (Signed)
SUBJECTIVE: 82 y.o. year old male presents for diabetic foot care. Patient request toe nails trimmed. Patient doesn't remember his blood sugar level. Stated that it stays sweet.   REVIEW OF SYSTEMS: Pertinent items noted in HPI and remainder of comprehensive ROS otherwise negative.  OBJECTIVE: DERMATOLOGIC EXAMINATION: Nails: Thick dystrophic nails x 10. No abnormal skin lesions noted.  VASCULAR EXAMINATION OF LOWER LIMBS: Pedal pulses both DP and PT are not palpable bilateral. Cool temperature on both feet dorsum. Mild forefoot edema bilateral.   NEUROLOGIC EXAMINATION OF THE LOWER LIMBS: Achilles DTR is present and within normal. Failed to respond to Monofilament (Semmes-Weinstein 10-gm) sensory testing 6 out of 6, bilateral. Vibratory sensations(128Hz  turning fork) intact at medial and lateral forefoot bilateral.  Sharp and Dull discriminatory sensations at the plantar ball of hallux is intact bilateral.   MUSCULOSKELETAL EXAMINATION: Positive for Valgus rotated right great toe. High arched cavus type foot bilateral.  ASSESSMENT: Onychomycosis x 10. Diabetic neuropathy.  PLAN: Reviewed findings and available treatment options. All nails debrided. May return in 3 month.

## 2016-08-02 DIAGNOSIS — Z87891 Personal history of nicotine dependence: Secondary | ICD-10-CM | POA: Diagnosis not present

## 2016-08-02 DIAGNOSIS — F0390 Unspecified dementia without behavioral disturbance: Secondary | ICD-10-CM | POA: Diagnosis not present

## 2016-08-02 DIAGNOSIS — R410 Disorientation, unspecified: Secondary | ICD-10-CM | POA: Diagnosis not present

## 2016-08-02 DIAGNOSIS — Z8673 Personal history of transient ischemic attack (TIA), and cerebral infarction without residual deficits: Secondary | ICD-10-CM | POA: Diagnosis not present

## 2016-08-02 DIAGNOSIS — E1122 Type 2 diabetes mellitus with diabetic chronic kidney disease: Secondary | ICD-10-CM | POA: Diagnosis not present

## 2016-08-02 DIAGNOSIS — R2689 Other abnormalities of gait and mobility: Secondary | ICD-10-CM | POA: Diagnosis not present

## 2016-08-02 DIAGNOSIS — R0989 Other specified symptoms and signs involving the circulatory and respiratory systems: Secondary | ICD-10-CM | POA: Diagnosis not present

## 2016-08-02 DIAGNOSIS — I2510 Atherosclerotic heart disease of native coronary artery without angina pectoris: Secondary | ICD-10-CM | POA: Diagnosis not present

## 2016-08-02 DIAGNOSIS — E7800 Pure hypercholesterolemia, unspecified: Secondary | ICD-10-CM | POA: Diagnosis not present

## 2016-08-02 DIAGNOSIS — I252 Old myocardial infarction: Secondary | ICD-10-CM | POA: Diagnosis not present

## 2016-08-02 DIAGNOSIS — I129 Hypertensive chronic kidney disease with stage 1 through stage 4 chronic kidney disease, or unspecified chronic kidney disease: Secondary | ICD-10-CM | POA: Diagnosis not present

## 2016-08-02 DIAGNOSIS — R569 Unspecified convulsions: Secondary | ICD-10-CM | POA: Diagnosis not present

## 2016-08-02 DIAGNOSIS — N183 Chronic kidney disease, stage 3 (moderate): Secondary | ICD-10-CM | POA: Diagnosis not present

## 2016-08-02 DIAGNOSIS — R4182 Altered mental status, unspecified: Secondary | ICD-10-CM | POA: Diagnosis not present

## 2016-08-06 ENCOUNTER — Telehealth: Payer: Self-pay

## 2016-08-06 NOTE — Telephone Encounter (Signed)
I wouldn't recommend any specific medications at this point in time it just continue to keep the referral eye on him for any repeat seizure activity.

## 2016-08-06 NOTE — Telephone Encounter (Signed)
Wife of pt called stating pt was seen in the ER on 7/12 after pt had a seizure. ER did not give pt any medications/treatments because they did not see him have the seizure. Pt has been scheduled for a HFU next week but wife would like to know if there's any type of treatment that he should be on now? Please advise.

## 2016-08-07 NOTE — Telephone Encounter (Signed)
Wife of pt notified no questions or concerns at this time.

## 2016-08-16 ENCOUNTER — Encounter: Payer: Self-pay | Admitting: Family Medicine

## 2016-08-16 ENCOUNTER — Ambulatory Visit (INDEPENDENT_AMBULATORY_CARE_PROVIDER_SITE_OTHER): Payer: Medicare Other | Admitting: Family Medicine

## 2016-08-16 VITALS — BP 109/68 | HR 102 | Ht 75.0 in | Wt 245.0 lb

## 2016-08-16 DIAGNOSIS — I2510 Atherosclerotic heart disease of native coronary artery without angina pectoris: Secondary | ICD-10-CM | POA: Diagnosis not present

## 2016-08-16 DIAGNOSIS — G301 Alzheimer's disease with late onset: Secondary | ICD-10-CM

## 2016-08-16 DIAGNOSIS — Z8673 Personal history of transient ischemic attack (TIA), and cerebral infarction without residual deficits: Secondary | ICD-10-CM

## 2016-08-16 DIAGNOSIS — G4700 Insomnia, unspecified: Secondary | ICD-10-CM

## 2016-08-16 DIAGNOSIS — F0280 Dementia in other diseases classified elsewhere without behavioral disturbance: Secondary | ICD-10-CM | POA: Diagnosis not present

## 2016-08-16 NOTE — Patient Instructions (Addendum)
Discontinue trazodone.

## 2016-08-16 NOTE — Progress Notes (Signed)
Subjective:    Patient ID: Joshua Scott, male    DOB: 05-10-1934, 82 y.o.   MRN: [id number]  HPI 82 -year-old male With a history of stroke was recently seen in emergency department for possible seizure. His wife had difficulty arousing him from bed that morning and he was snoring loudly and did not fully wake up even with sternal rub. He was experiencing altered mental status and confusion earlier that day on July 12. The episode lasted for several minutes. He denied any head injury or known trauma. No fevers chills or signs of infection. Head CT was negative for any changes.  NO Arm or limb jerking type activity.  He is on ASA 325 as he does have a prior history of stroke.   Son reports that he's no longer on Aricept but it was discontinued at some point in time. H is not clear why.He reports that the youngest son who is his healthcare power of attorney is actually been looking into placement options for when his wife Tilda Burrow is unable to take care of him. She is on chronic oxygen therapy and quite frail. He has not been driving for at least the last year and a half.  Review of Systems  BP 109/68   Pulse (!) 102   Ht 6\' 3"  (1.905 m)   Wt 245 lb (111.1 kg)   SpO2 97%   BMI 30.62 kg/m     Allergies  Allergen Reactions  . Ativan [Lorazepam] Other (See Comments) and Anaphylaxis    Agitatoin.  Agitatoin.   . Haloperidol Other (See Comments)    unknown unknown unknown  . Metformin Other (See Comments)    CKD 3 cuased worsening renal level.  CKD 3 cuased worsening renal level.  CKD 3 cuased worsening renal level.   . Metformin And Related Other (See Comments)    CKD 3 cuased worsening renal level.     Past Medical History:  Diagnosis Date  . CAD (coronary artery disease)   . Diabetes mellitus   . ED (erectile dysfunction)   . Gout   . Hyperlipidemia   . Hypertension     Past Surgical History:  Procedure Laterality Date  . CATARACT EXTRACTION W/ INTRAOCULAR LENS IMPLANT   06/14/11   Left eye  . CATARACT EXTRACTION W/ INTRAOCULAR LENS IMPLANT  07/03/11   Right eye  . LEFT HEART CATHETERIZATION WITH CORONARY ANGIOGRAM N/A 07/14/2013   Procedure: LEFT HEART CATHETERIZATION WITH CORONARY ANGIOGRAM;  Surgeon: Peter M Martinique, MD;  Location: [facility] CATH LAB;  Service: Cardiovascular;  Laterality: N/A;    Social History   Social History  . Marital status: Married    Spouse name: N/A  . Number of children: 3  . Years of education: N/A   Occupational History  .      Retired from [employer]  . Smoking status: Former Smoker    Quit date: 01/23/1984  . Smokeless tobacco: Never Used  . Alcohol use No  . Drug use: No  . Sexual activity: Not on file   Other Topics Concern  . Not on file   Social History Narrative  . No narrative on file    Family History  Problem Relation Age of Onset  . Diabetes Mother     Outpatient Encounter Prescriptions as of 08/16/2016  Medication Sig  . allopurinol (ZYLOPRIM) 300 MG tablet TAKE 1 TABLET DAILY  . aspirin 325 MG tablet Take 325 mg by mouth daily.  Marland Kitchen  Docusate Sodium (STOOL SOFTENER) 100 MG capsule Take 100 mg by mouth 2 (two) times daily.  Marland Kitchen doxazosin (CARDURA) 4 MG tablet Take 1 tablet (4 mg total) by mouth at bedtime.  Marland Kitchen glipiZIDE (GLIPIZIDE XL) 5 MG 24 hr tablet Take 1 tablet (5 mg total) by mouth daily with breakfast.  . sertraline (ZOLOFT) 100 MG tablet Take 1 tablet (100 mg total) by mouth daily. Take 1 tab by mouth daily  . TOPROL XL 25 MG 24 hr tablet TAKE 1 TABLET DAILY  . valsartan (DIOVAN) 320 MG tablet Take 1 tablet (320 mg total) by mouth daily.  . vitamin B-12 (CYANOCOBALAMIN) 1000 MCG tablet Take 1,000 mcg by mouth.  . [DISCONTINUED] donepezil (ARICEPT) 10 MG tablet Take 10 mg by mouth at bedtime.  . [DISCONTINUED] traZODone (DESYREL) 50 MG tablet Take 0.5 tablets (25 mg total) by mouth at bedtime. 30 MINUTES BEFORE BED   No facility-administered encounter medications on file  as of 08/16/2016.          Objective:   Physical Exam  Constitutional: He is oriented to person, place, and time. He appears well-developed and well-nourished.  HENT:  Head: Normocephalic and atraumatic.  Right Ear: External ear normal.  Left Ear: External ear normal.  Nose: Nose normal.  Mouth/Throat: Oropharynx is clear and moist.  Left canal is blocked by cerumen. Right TM and canal is clear.  Eyes: Pupils are equal, round, and reactive to light. Conjunctivae and EOM are normal.  Neck: Neck supple. No thyromegaly present.  Cardiovascular: Normal rate and normal heart sounds.   Pulmonary/Chest: Effort normal and breath sounds normal.  Lymphadenopathy:    He has no cervical adenopathy.  Neurological: He is alert and oriented to person, place, and time.  Skin: Skin is warm and dry.  Psychiatric: He has a normal mood and affect.          Assessment & Plan:  Dementia, Most likely vascular - Severe-MMSE score of 4 today and he seemed quite confused during most of the test. Discussed with the son that they really need to start looking into options for possible placement as his wife is very frail and overwhelmed. There is good support from the children in their evidently Artie looking into some options. Since I am not clear why the Aricept was stopped will continue to hold it for now. Try to go back to the notes in figure out when and where it may have been stopped. It may have been during hospitalization.  Insomnia-discontinue trazodone. Can increase risk of falls and excess sedation during the daytime.  He has not had any more episodes where he was difficult to arouse. It's still not clear whether or not this was an actual seizure or not. There is no need for antiseizure drugs at this point and gave the son reassurance.  History of stroke-continue with daily aspirin therapy 3935 mg.

## 2016-08-20 ENCOUNTER — Telehealth: Payer: Self-pay | Admitting: *Deleted

## 2016-08-20 NOTE — Telephone Encounter (Signed)
Patient's wife is calling wanting to know if Dr. Madilyn Fireman can write a brief summary  for the patient about his diagnosis of alzheimers and DM and decreased kidney function. They are trying to get more compensation from disability from the [place]. The wife states if the [place] has more information as to what type of care the patient has received from the provider and written by the provider as well has how these conditions have affected him then they may be able to get more help. The wife is aware that Dr. Madilyn Fireman is not going to be in the office this week. She asked that she get to it whenever she could

## 2016-08-29 ENCOUNTER — Telehealth: Payer: Self-pay

## 2016-08-29 NOTE — Telephone Encounter (Signed)
To Whom It May Concern,  in regards to Zarif Rathje, date of birth January 18, 1935,  Mr. Wehmeyer has been my patient for 11 years. He is an 82 year old male with known coronary artery disease, high blood pressure, history of stroke in 2016, thoracic aortic aneurysm, chronic renal disease (stage 3), hyperlipidemia, and gout. More recently he has been diagnosed with severe dementia with a Mini-Mental score of 4. This is significantly impairing his day-to-day quality of life and his ability to care for himself.   He no longer drives. He is no longer able to manage his own finances.  He is no longer able to manage his own medications and is becoming more of a burden for his primary caregiver who is his wife. More recently he actually experienced seizure-like activity. His dementia is most likely vascular in nature. He is no longer able to maintain his own personal hygiene and is starting to become incontinent. He is currently a high fall risk. At this point he would actually be best suited or nursing home level care for his own safety.  If you have any concerns or further questions or need any more documentation please feel free to contact our office at [phone]. Fax number is [phone].   Sincerely,   Beatrice Lecher, MD

## 2016-08-29 NOTE — Telephone Encounter (Signed)
Please call his wife and let her know that the letter is ready for her to pick up.

## 2016-08-29 NOTE — Telephone Encounter (Signed)
Patient wife called stated that she needs a letter stating what all PCP has done for the patient diabetes and his overall  kidney function so that he can get more disability. .please advise. Rhonda Cunningham,CMA

## 2016-08-29 NOTE — Telephone Encounter (Signed)
See prior phone note. It is ready to pick up.

## 2016-08-30 NOTE — Telephone Encounter (Signed)
Spoke to patient spouse gave advised her that letter is ready for pickup, she stated that her son will pick it up. Rhonda Cunningham,CMA

## 2016-09-03 ENCOUNTER — Encounter: Payer: Self-pay | Admitting: Podiatry

## 2016-09-03 ENCOUNTER — Ambulatory Visit (INDEPENDENT_AMBULATORY_CARE_PROVIDER_SITE_OTHER): Payer: Medicare Other | Admitting: Podiatry

## 2016-09-03 DIAGNOSIS — M79673 Pain in unspecified foot: Secondary | ICD-10-CM | POA: Diagnosis not present

## 2016-09-03 DIAGNOSIS — B351 Tinea unguium: Secondary | ICD-10-CM | POA: Diagnosis not present

## 2016-09-03 DIAGNOSIS — E0842 Diabetes mellitus due to underlying condition with diabetic polyneuropathy: Secondary | ICD-10-CM | POA: Diagnosis not present

## 2016-09-03 NOTE — Progress Notes (Signed)
SUBJECTIVE: 82 y.o. year old male presents accompanied by his son for diabetic foot care. Patient request toe nails trimmed. Stated that his blood sugar is under control.  OBJECTIVE: DERMATOLOGIC EXAMINATION: Nails: Thick dystrophic nails x 10. No abnormal skin lesions noted.  VASCULAR EXAMINATION OF LOWER LIMBS: Pedal pulses: DP on both feet are faintly palpable and PT are not palpable bilateral. Cool temperature on both feet dorsum. Mild forefoot edema bilateral.   NEUROLOGIC EXAMINATION OF THE LOWER LIMBS: Achilles DTR is present and within normal. Failed to respond to Monofilament (Semmes-Weinstein 10-gm) sensory testing 6 out of 6, bilateral. Vibratory sensations(128Hz  turning fork) intact at medial and lateral forefoot bilateral.  Sharp and Dull discriminatory sensations at the plantar ball of hallux is intact bilateral.   MUSCULOSKELETAL EXAMINATION: Positive for Valgus rotated right great toe. High arched cavus type foot bilateral.  ASSESSMENT: Onychomycosis x 10. Diabetic neuropathy.  PLAN: Reviewed findings and available treatment options. All nails debrided. May return in 3 month.

## 2016-09-03 NOTE — Patient Instructions (Signed)
Seen for hypertrophic nails. All nails debrided. Return in 3 months or as needed.  

## 2016-09-04 ENCOUNTER — Other Ambulatory Visit: Payer: Self-pay | Admitting: Cardiology

## 2016-09-04 ENCOUNTER — Other Ambulatory Visit: Payer: Self-pay | Admitting: Family Medicine

## 2016-09-04 DIAGNOSIS — I10 Essential (primary) hypertension: Secondary | ICD-10-CM

## 2016-09-07 ENCOUNTER — Telehealth: Payer: Self-pay | Admitting: Family Medicine

## 2016-09-07 NOTE — Telephone Encounter (Signed)
Pt's son called. He is following up on letter  that is supposed to be re-done( letter is for father to be admitted into a Nursing Home facility).  Thank you.

## 2016-09-07 NOTE — Telephone Encounter (Signed)
Spoke with Pt's son Nicki Reaper [phone]), advised that PCP is working on letter and we will contact him when it is ready. Verbalized understanding.

## 2016-09-20 ENCOUNTER — Encounter: Payer: Self-pay | Admitting: Family Medicine

## 2016-10-18 ENCOUNTER — Ambulatory Visit (INDEPENDENT_AMBULATORY_CARE_PROVIDER_SITE_OTHER): Payer: Medicare Other | Admitting: Family Medicine

## 2016-10-18 ENCOUNTER — Encounter: Payer: Self-pay | Admitting: Family Medicine

## 2016-10-18 VITALS — BP 133/80 | HR 86 | Wt 248.0 lb

## 2016-10-18 DIAGNOSIS — Z23 Encounter for immunization: Secondary | ICD-10-CM

## 2016-10-18 DIAGNOSIS — N183 Chronic kidney disease, stage 3 (moderate): Secondary | ICD-10-CM | POA: Diagnosis not present

## 2016-10-18 DIAGNOSIS — G301 Alzheimer's disease with late onset: Secondary | ICD-10-CM

## 2016-10-18 DIAGNOSIS — E0822 Diabetes mellitus due to underlying condition with diabetic chronic kidney disease: Secondary | ICD-10-CM | POA: Diagnosis not present

## 2016-10-18 DIAGNOSIS — G4700 Insomnia, unspecified: Secondary | ICD-10-CM | POA: Diagnosis not present

## 2016-10-18 DIAGNOSIS — I2510 Atherosclerotic heart disease of native coronary artery without angina pectoris: Secondary | ICD-10-CM

## 2016-10-18 DIAGNOSIS — F0280 Dementia in other diseases classified elsewhere without behavioral disturbance: Secondary | ICD-10-CM | POA: Diagnosis not present

## 2016-10-18 LAB — POCT GLYCOSYLATED HEMOGLOBIN (HGB A1C): HEMOGLOBIN A1C: 7.1

## 2016-10-18 NOTE — Progress Notes (Signed)
Subjective:    CC: DM  HPI:  Diabetes - no hypoglycemic events. No wounds or sores that are not healing well. No increased thirst or urination. Checking glucose at home. Taking medications as prescribed without any side effects.  Hasn't had a recent eye exam.   INomsnia - We stopped his trazodone at last office visit.His son reports that overall he is doing well. He's had a few nights where he stayed up most of the night but overall it's been manageable.   Advanced dementia-his son and wife are actively looking for placement for him in a nursing home. They are getting some resources through the [place] better still having some difficulty figuring out how they will financially cover the cost of it.  Past medical history, Surgical history, Family history not pertinant except as noted below, Social history, Allergies, and medications have been entered into the medical record, reviewed, and corrections made.   Review of Systems: No fevers, chills, night sweats, weight loss, chest pain, or shortness of breath.   Objective:    General: Well Developed, well nourished, and in no acute distress.  Neuro: Alert and oriented x3, extra-ocular muscles intact, sensation grossly intact.  HEENT: Normocephalic, atraumatic  Skin: Warm and dry, no rashes. Cardiac: Regular rate and rhythm, no murmurs rubs or gallops, no lower extremity edema.  Respiratory: Clear to auscultation bilaterally. Not using accessory muscles, speaking in full sentences.   Impression and Recommendations:    DM- . Uncontrolled. Hemoglobin A1c at 7.2 today from previous of 6.2. His son admits he's been eating a lot of sweets things like milkshakes etc. We discussed backing off on that some and eating in moderation. Will not adjust his medication today but will follow carefully. Follow-up again in 3 months. He is on an arm but. Not currently on a statin. Foot exam performed today. Flu vaccine given today.  Insomnia - doing well  overall.  Advanced dementia- [facility] Pace on getting him placed in a skilled nursing facility.  I really like to work on weaning him off his Zoloft next time I see him if everything still stable.

## 2016-11-19 ENCOUNTER — Telehealth: Payer: Self-pay | Admitting: Family Medicine

## 2016-11-19 DIAGNOSIS — Z0189 Encounter for other specified special examinations: Secondary | ICD-10-CM

## 2016-11-19 NOTE — Telephone Encounter (Signed)
Joshua Scott from [facility] has called multiple times and needs an [facility] asap he is moving in this week. Joshua Scott has left several messages on nurse line. [phone] is the best contact number to reach her if have any questions. It can be faxed too [phone] the system is down currently and there  I[employer] is working on it please call Joshua Scott. Thanks

## 2016-11-19 NOTE — Telephone Encounter (Signed)
Will you call them back and let them know we will fax this afternoon. They didn't drop off or fax [place] forms but we were able to find some online and we have used those.

## 2016-11-20 NOTE — Telephone Encounter (Signed)
Spoke with assisted living, they have everything they need.

## 2016-11-26 ENCOUNTER — Ambulatory Visit: Payer: Medicare Other | Admitting: Podiatry

## 2016-11-28 ENCOUNTER — Telehealth: Payer: Self-pay | Admitting: *Deleted

## 2016-11-28 NOTE — Telephone Encounter (Signed)
Called and informed pt's son that forms have been completed and they will be up front for p/u. Copy made and scanned.Joshua Scott

## 2016-11-30 DIAGNOSIS — F0280 Dementia in other diseases classified elsewhere without behavioral disturbance: Secondary | ICD-10-CM | POA: Diagnosis not present

## 2016-11-30 DIAGNOSIS — I10 Essential (primary) hypertension: Secondary | ICD-10-CM | POA: Diagnosis not present

## 2016-12-01 DIAGNOSIS — N183 Chronic kidney disease, stage 3 (moderate): Secondary | ICD-10-CM | POA: Diagnosis not present

## 2016-12-01 DIAGNOSIS — G301 Alzheimer's disease with late onset: Secondary | ICD-10-CM | POA: Diagnosis not present

## 2016-12-01 DIAGNOSIS — I129 Hypertensive chronic kidney disease with stage 1 through stage 4 chronic kidney disease, or unspecified chronic kidney disease: Secondary | ICD-10-CM | POA: Diagnosis not present

## 2016-12-01 DIAGNOSIS — Z9181 History of falling: Secondary | ICD-10-CM | POA: Diagnosis not present

## 2016-12-01 DIAGNOSIS — F0280 Dementia in other diseases classified elsewhere without behavioral disturbance: Secondary | ICD-10-CM | POA: Diagnosis not present

## 2016-12-01 DIAGNOSIS — E1122 Type 2 diabetes mellitus with diabetic chronic kidney disease: Secondary | ICD-10-CM | POA: Diagnosis not present

## 2016-12-03 ENCOUNTER — Encounter: Payer: Self-pay | Admitting: Podiatry

## 2016-12-03 ENCOUNTER — Ambulatory Visit (INDEPENDENT_AMBULATORY_CARE_PROVIDER_SITE_OTHER): Payer: Medicare Other | Admitting: Podiatry

## 2016-12-03 DIAGNOSIS — B351 Tinea unguium: Secondary | ICD-10-CM

## 2016-12-03 DIAGNOSIS — E0842 Diabetes mellitus due to underlying condition with diabetic polyneuropathy: Secondary | ICD-10-CM

## 2016-12-03 NOTE — Progress Notes (Signed)
Subjective: 82 y.o. year old male patient presents accompanied by care taker requesting toe nails trimmed. Patient is wheel chair bound. Patient was not able to move himself to treatment chair.  Objective: Dermatologic: Thick yellow deformed nails x 10. Ingrown hallucal nails medial border without broken skin.  Vascular: Pedal pulses are not palpable both PT and DP bilateral. Orthopedic: Cavus type high arched foot with valgus rotated right great toe. Neurologic: Failed to respond to monofilament sensory testing bilateral.  Assessment: Dystrophic mycotic nails x 10. Peripheral neuropathy. Diabetic with kidney complication.  Treatment: All mycotic nails debrided.  Return in 3 months or as needed.

## 2016-12-03 NOTE — Patient Instructions (Signed)
Seen for hypertrophic nails. All nails debrided. Return in 3 months or as needed.  

## 2016-12-04 DIAGNOSIS — Z888 Allergy status to other drugs, medicaments and biological substances status: Secondary | ICD-10-CM | POA: Diagnosis not present

## 2016-12-04 DIAGNOSIS — I10 Essential (primary) hypertension: Secondary | ICD-10-CM | POA: Diagnosis not present

## 2016-12-04 DIAGNOSIS — Z9181 History of falling: Secondary | ICD-10-CM | POA: Diagnosis not present

## 2016-12-04 DIAGNOSIS — E1159 Type 2 diabetes mellitus with other circulatory complications: Secondary | ICD-10-CM | POA: Diagnosis not present

## 2016-12-04 DIAGNOSIS — N183 Chronic kidney disease, stage 3 (moderate): Secondary | ICD-10-CM | POA: Diagnosis not present

## 2016-12-04 DIAGNOSIS — F0280 Dementia in other diseases classified elsewhere without behavioral disturbance: Secondary | ICD-10-CM | POA: Diagnosis not present

## 2016-12-04 DIAGNOSIS — G300 Alzheimer's disease with early onset: Secondary | ICD-10-CM | POA: Diagnosis not present

## 2016-12-04 DIAGNOSIS — Z79899 Other long term (current) drug therapy: Secondary | ICD-10-CM | POA: Diagnosis not present

## 2016-12-04 DIAGNOSIS — I129 Hypertensive chronic kidney disease with stage 1 through stage 4 chronic kidney disease, or unspecified chronic kidney disease: Secondary | ICD-10-CM | POA: Diagnosis not present

## 2016-12-04 DIAGNOSIS — S50311A Abrasion of right elbow, initial encounter: Secondary | ICD-10-CM | POA: Diagnosis not present

## 2016-12-04 DIAGNOSIS — Z7984 Long term (current) use of oral hypoglycemic drugs: Secondary | ICD-10-CM | POA: Diagnosis not present

## 2016-12-04 DIAGNOSIS — S20412A Abrasion of left back wall of thorax, initial encounter: Secondary | ICD-10-CM | POA: Diagnosis not present

## 2016-12-04 DIAGNOSIS — E1122 Type 2 diabetes mellitus with diabetic chronic kidney disease: Secondary | ICD-10-CM | POA: Diagnosis not present

## 2016-12-04 DIAGNOSIS — Z7409 Other reduced mobility: Secondary | ICD-10-CM | POA: Diagnosis not present

## 2016-12-04 DIAGNOSIS — R4182 Altered mental status, unspecified: Secondary | ICD-10-CM | POA: Diagnosis not present

## 2016-12-04 DIAGNOSIS — S40819A Abrasion of unspecified upper arm, initial encounter: Secondary | ICD-10-CM | POA: Diagnosis not present

## 2016-12-04 DIAGNOSIS — Z8673 Personal history of transient ischemic attack (TIA), and cerebral infarction without residual deficits: Secondary | ICD-10-CM | POA: Diagnosis not present

## 2016-12-04 DIAGNOSIS — W06XXXA Fall from bed, initial encounter: Secondary | ICD-10-CM | POA: Diagnosis not present

## 2016-12-04 DIAGNOSIS — R269 Unspecified abnormalities of gait and mobility: Secondary | ICD-10-CM | POA: Diagnosis not present

## 2016-12-04 DIAGNOSIS — S50312A Abrasion of left elbow, initial encounter: Secondary | ICD-10-CM | POA: Diagnosis not present

## 2016-12-04 DIAGNOSIS — G301 Alzheimer's disease with late onset: Secondary | ICD-10-CM | POA: Diagnosis not present

## 2016-12-04 DIAGNOSIS — F0390 Unspecified dementia without behavioral disturbance: Secondary | ICD-10-CM | POA: Diagnosis not present

## 2016-12-04 DIAGNOSIS — F320 Major depressive disorder, single episode, mild: Secondary | ICD-10-CM | POA: Diagnosis not present

## 2016-12-04 DIAGNOSIS — E119 Type 2 diabetes mellitus without complications: Secondary | ICD-10-CM | POA: Diagnosis not present

## 2016-12-04 DIAGNOSIS — Z7982 Long term (current) use of aspirin: Secondary | ICD-10-CM | POA: Diagnosis not present

## 2016-12-04 DIAGNOSIS — S20411A Abrasion of right back wall of thorax, initial encounter: Secondary | ICD-10-CM | POA: Diagnosis not present

## 2016-12-04 DIAGNOSIS — I2510 Atherosclerotic heart disease of native coronary artery without angina pectoris: Secondary | ICD-10-CM | POA: Diagnosis not present

## 2016-12-04 DIAGNOSIS — G309 Alzheimer's disease, unspecified: Secondary | ICD-10-CM | POA: Diagnosis not present

## 2016-12-05 DIAGNOSIS — G301 Alzheimer's disease with late onset: Secondary | ICD-10-CM | POA: Diagnosis not present

## 2016-12-05 DIAGNOSIS — E1122 Type 2 diabetes mellitus with diabetic chronic kidney disease: Secondary | ICD-10-CM | POA: Diagnosis not present

## 2016-12-05 DIAGNOSIS — F0280 Dementia in other diseases classified elsewhere without behavioral disturbance: Secondary | ICD-10-CM | POA: Diagnosis not present

## 2016-12-05 DIAGNOSIS — I129 Hypertensive chronic kidney disease with stage 1 through stage 4 chronic kidney disease, or unspecified chronic kidney disease: Secondary | ICD-10-CM | POA: Diagnosis not present

## 2016-12-05 DIAGNOSIS — N183 Chronic kidney disease, stage 3 (moderate): Secondary | ICD-10-CM | POA: Diagnosis not present

## 2016-12-05 DIAGNOSIS — Z9181 History of falling: Secondary | ICD-10-CM | POA: Diagnosis not present

## 2016-12-07 DIAGNOSIS — G301 Alzheimer's disease with late onset: Secondary | ICD-10-CM | POA: Diagnosis not present

## 2016-12-07 DIAGNOSIS — F0280 Dementia in other diseases classified elsewhere without behavioral disturbance: Secondary | ICD-10-CM | POA: Diagnosis not present

## 2016-12-07 DIAGNOSIS — E1122 Type 2 diabetes mellitus with diabetic chronic kidney disease: Secondary | ICD-10-CM | POA: Diagnosis not present

## 2016-12-07 DIAGNOSIS — Z9181 History of falling: Secondary | ICD-10-CM | POA: Diagnosis not present

## 2016-12-07 DIAGNOSIS — N183 Chronic kidney disease, stage 3 (moderate): Secondary | ICD-10-CM | POA: Diagnosis not present

## 2016-12-07 DIAGNOSIS — I129 Hypertensive chronic kidney disease with stage 1 through stage 4 chronic kidney disease, or unspecified chronic kidney disease: Secondary | ICD-10-CM | POA: Diagnosis not present

## 2016-12-10 DIAGNOSIS — G301 Alzheimer's disease with late onset: Secondary | ICD-10-CM | POA: Diagnosis not present

## 2016-12-10 DIAGNOSIS — I129 Hypertensive chronic kidney disease with stage 1 through stage 4 chronic kidney disease, or unspecified chronic kidney disease: Secondary | ICD-10-CM | POA: Diagnosis not present

## 2016-12-10 DIAGNOSIS — N183 Chronic kidney disease, stage 3 (moderate): Secondary | ICD-10-CM | POA: Diagnosis not present

## 2016-12-10 DIAGNOSIS — F0280 Dementia in other diseases classified elsewhere without behavioral disturbance: Secondary | ICD-10-CM | POA: Diagnosis not present

## 2016-12-10 DIAGNOSIS — Z9181 History of falling: Secondary | ICD-10-CM | POA: Diagnosis not present

## 2016-12-10 DIAGNOSIS — E1122 Type 2 diabetes mellitus with diabetic chronic kidney disease: Secondary | ICD-10-CM | POA: Diagnosis not present

## 2016-12-11 DIAGNOSIS — M79602 Pain in left arm: Secondary | ICD-10-CM | POA: Diagnosis not present

## 2016-12-11 DIAGNOSIS — N183 Chronic kidney disease, stage 3 (moderate): Secondary | ICD-10-CM | POA: Diagnosis not present

## 2016-12-11 DIAGNOSIS — I129 Hypertensive chronic kidney disease with stage 1 through stage 4 chronic kidney disease, or unspecified chronic kidney disease: Secondary | ICD-10-CM | POA: Diagnosis not present

## 2016-12-11 DIAGNOSIS — G301 Alzheimer's disease with late onset: Secondary | ICD-10-CM | POA: Diagnosis not present

## 2016-12-11 DIAGNOSIS — F0280 Dementia in other diseases classified elsewhere without behavioral disturbance: Secondary | ICD-10-CM | POA: Diagnosis not present

## 2016-12-11 DIAGNOSIS — M25522 Pain in left elbow: Secondary | ICD-10-CM | POA: Diagnosis not present

## 2016-12-11 DIAGNOSIS — Z9181 History of falling: Secondary | ICD-10-CM | POA: Diagnosis not present

## 2016-12-11 DIAGNOSIS — R61 Generalized hyperhidrosis: Secondary | ICD-10-CM | POA: Diagnosis not present

## 2016-12-11 DIAGNOSIS — E1122 Type 2 diabetes mellitus with diabetic chronic kidney disease: Secondary | ICD-10-CM | POA: Diagnosis not present

## 2016-12-12 DIAGNOSIS — F0280 Dementia in other diseases classified elsewhere without behavioral disturbance: Secondary | ICD-10-CM | POA: Diagnosis not present

## 2016-12-12 DIAGNOSIS — Z9181 History of falling: Secondary | ICD-10-CM | POA: Diagnosis not present

## 2016-12-12 DIAGNOSIS — E1122 Type 2 diabetes mellitus with diabetic chronic kidney disease: Secondary | ICD-10-CM | POA: Diagnosis not present

## 2016-12-12 DIAGNOSIS — N183 Chronic kidney disease, stage 3 (moderate): Secondary | ICD-10-CM | POA: Diagnosis not present

## 2016-12-12 DIAGNOSIS — I129 Hypertensive chronic kidney disease with stage 1 through stage 4 chronic kidney disease, or unspecified chronic kidney disease: Secondary | ICD-10-CM | POA: Diagnosis not present

## 2016-12-12 DIAGNOSIS — G301 Alzheimer's disease with late onset: Secondary | ICD-10-CM | POA: Diagnosis not present

## 2016-12-14 DIAGNOSIS — G300 Alzheimer's disease with early onset: Secondary | ICD-10-CM | POA: Diagnosis not present

## 2016-12-14 DIAGNOSIS — G9389 Other specified disorders of brain: Secondary | ICD-10-CM | POA: Diagnosis not present

## 2016-12-14 DIAGNOSIS — Z9181 History of falling: Secondary | ICD-10-CM | POA: Diagnosis not present

## 2016-12-14 DIAGNOSIS — G301 Alzheimer's disease with late onset: Secondary | ICD-10-CM | POA: Diagnosis not present

## 2016-12-14 DIAGNOSIS — Z79899 Other long term (current) drug therapy: Secondary | ICD-10-CM | POA: Diagnosis not present

## 2016-12-14 DIAGNOSIS — G309 Alzheimer's disease, unspecified: Secondary | ICD-10-CM | POA: Diagnosis not present

## 2016-12-14 DIAGNOSIS — Z7984 Long term (current) use of oral hypoglycemic drugs: Secondary | ICD-10-CM | POA: Diagnosis not present

## 2016-12-14 DIAGNOSIS — I129 Hypertensive chronic kidney disease with stage 1 through stage 4 chronic kidney disease, or unspecified chronic kidney disease: Secondary | ICD-10-CM | POA: Diagnosis not present

## 2016-12-14 DIAGNOSIS — Z043 Encounter for examination and observation following other accident: Secondary | ICD-10-CM | POA: Diagnosis not present

## 2016-12-14 DIAGNOSIS — W19XXXA Unspecified fall, initial encounter: Secondary | ICD-10-CM | POA: Diagnosis not present

## 2016-12-14 DIAGNOSIS — F0280 Dementia in other diseases classified elsewhere without behavioral disturbance: Secondary | ICD-10-CM | POA: Diagnosis not present

## 2016-12-14 DIAGNOSIS — Z888 Allergy status to other drugs, medicaments and biological substances status: Secondary | ICD-10-CM | POA: Diagnosis not present

## 2016-12-14 DIAGNOSIS — Z8673 Personal history of transient ischemic attack (TIA), and cerebral infarction without residual deficits: Secondary | ICD-10-CM | POA: Diagnosis not present

## 2016-12-14 DIAGNOSIS — S0091XA Abrasion of unspecified part of head, initial encounter: Secondary | ICD-10-CM | POA: Diagnosis not present

## 2016-12-14 DIAGNOSIS — R269 Unspecified abnormalities of gait and mobility: Secondary | ICD-10-CM | POA: Diagnosis not present

## 2016-12-14 DIAGNOSIS — F0390 Unspecified dementia without behavioral disturbance: Secondary | ICD-10-CM | POA: Diagnosis not present

## 2016-12-14 DIAGNOSIS — Z7982 Long term (current) use of aspirin: Secondary | ICD-10-CM | POA: Diagnosis not present

## 2016-12-14 DIAGNOSIS — E1122 Type 2 diabetes mellitus with diabetic chronic kidney disease: Secondary | ICD-10-CM | POA: Diagnosis not present

## 2016-12-14 DIAGNOSIS — N183 Chronic kidney disease, stage 3 (moderate): Secondary | ICD-10-CM | POA: Diagnosis not present

## 2016-12-15 DIAGNOSIS — Z7409 Other reduced mobility: Secondary | ICD-10-CM | POA: Diagnosis not present

## 2016-12-15 DIAGNOSIS — F0390 Unspecified dementia without behavioral disturbance: Secondary | ICD-10-CM | POA: Diagnosis not present

## 2016-12-15 DIAGNOSIS — G9389 Other specified disorders of brain: Secondary | ICD-10-CM | POA: Diagnosis not present

## 2016-12-17 DIAGNOSIS — F0391 Unspecified dementia with behavioral disturbance: Secondary | ICD-10-CM | POA: Diagnosis not present

## 2016-12-17 DIAGNOSIS — N183 Chronic kidney disease, stage 3 (moderate): Secondary | ICD-10-CM | POA: Diagnosis not present

## 2016-12-17 DIAGNOSIS — F0280 Dementia in other diseases classified elsewhere without behavioral disturbance: Secondary | ICD-10-CM | POA: Diagnosis not present

## 2016-12-17 DIAGNOSIS — G301 Alzheimer's disease with late onset: Secondary | ICD-10-CM | POA: Diagnosis not present

## 2016-12-17 DIAGNOSIS — Z9181 History of falling: Secondary | ICD-10-CM | POA: Diagnosis not present

## 2016-12-17 DIAGNOSIS — I129 Hypertensive chronic kidney disease with stage 1 through stage 4 chronic kidney disease, or unspecified chronic kidney disease: Secondary | ICD-10-CM | POA: Diagnosis not present

## 2016-12-17 DIAGNOSIS — R296 Repeated falls: Secondary | ICD-10-CM | POA: Diagnosis not present

## 2016-12-17 DIAGNOSIS — R454 Irritability and anger: Secondary | ICD-10-CM | POA: Diagnosis not present

## 2016-12-17 DIAGNOSIS — R451 Restlessness and agitation: Secondary | ICD-10-CM | POA: Diagnosis not present

## 2016-12-17 DIAGNOSIS — E1122 Type 2 diabetes mellitus with diabetic chronic kidney disease: Secondary | ICD-10-CM | POA: Diagnosis not present

## 2016-12-17 DIAGNOSIS — F4320 Adjustment disorder, unspecified: Secondary | ICD-10-CM | POA: Diagnosis not present

## 2016-12-18 DIAGNOSIS — Z9181 History of falling: Secondary | ICD-10-CM | POA: Diagnosis not present

## 2016-12-18 DIAGNOSIS — F0280 Dementia in other diseases classified elsewhere without behavioral disturbance: Secondary | ICD-10-CM | POA: Diagnosis not present

## 2016-12-18 DIAGNOSIS — I129 Hypertensive chronic kidney disease with stage 1 through stage 4 chronic kidney disease, or unspecified chronic kidney disease: Secondary | ICD-10-CM | POA: Diagnosis not present

## 2016-12-18 DIAGNOSIS — E1122 Type 2 diabetes mellitus with diabetic chronic kidney disease: Secondary | ICD-10-CM | POA: Diagnosis not present

## 2016-12-18 DIAGNOSIS — N183 Chronic kidney disease, stage 3 (moderate): Secondary | ICD-10-CM | POA: Diagnosis not present

## 2016-12-18 DIAGNOSIS — R5381 Other malaise: Secondary | ICD-10-CM | POA: Diagnosis not present

## 2016-12-18 DIAGNOSIS — G301 Alzheimer's disease with late onset: Secondary | ICD-10-CM | POA: Diagnosis not present

## 2016-12-19 DIAGNOSIS — N183 Chronic kidney disease, stage 3 (moderate): Secondary | ICD-10-CM | POA: Diagnosis not present

## 2016-12-19 DIAGNOSIS — E1122 Type 2 diabetes mellitus with diabetic chronic kidney disease: Secondary | ICD-10-CM | POA: Diagnosis not present

## 2016-12-19 DIAGNOSIS — G301 Alzheimer's disease with late onset: Secondary | ICD-10-CM | POA: Diagnosis not present

## 2016-12-19 DIAGNOSIS — F0280 Dementia in other diseases classified elsewhere without behavioral disturbance: Secondary | ICD-10-CM | POA: Diagnosis not present

## 2016-12-19 DIAGNOSIS — Z9181 History of falling: Secondary | ICD-10-CM | POA: Diagnosis not present

## 2016-12-19 DIAGNOSIS — I129 Hypertensive chronic kidney disease with stage 1 through stage 4 chronic kidney disease, or unspecified chronic kidney disease: Secondary | ICD-10-CM | POA: Diagnosis not present

## 2016-12-20 DIAGNOSIS — I129 Hypertensive chronic kidney disease with stage 1 through stage 4 chronic kidney disease, or unspecified chronic kidney disease: Secondary | ICD-10-CM | POA: Diagnosis not present

## 2016-12-20 DIAGNOSIS — Z7409 Other reduced mobility: Secondary | ICD-10-CM | POA: Diagnosis not present

## 2016-12-20 DIAGNOSIS — E1122 Type 2 diabetes mellitus with diabetic chronic kidney disease: Secondary | ICD-10-CM | POA: Diagnosis not present

## 2016-12-20 DIAGNOSIS — F0280 Dementia in other diseases classified elsewhere without behavioral disturbance: Secondary | ICD-10-CM | POA: Diagnosis not present

## 2016-12-20 DIAGNOSIS — I6781 Acute cerebrovascular insufficiency: Secondary | ICD-10-CM | POA: Diagnosis not present

## 2016-12-20 DIAGNOSIS — I2510 Atherosclerotic heart disease of native coronary artery without angina pectoris: Secondary | ICD-10-CM | POA: Diagnosis not present

## 2016-12-20 DIAGNOSIS — G300 Alzheimer's disease with early onset: Secondary | ICD-10-CM | POA: Diagnosis not present

## 2016-12-20 DIAGNOSIS — W19XXXA Unspecified fall, initial encounter: Secondary | ICD-10-CM | POA: Diagnosis not present

## 2016-12-20 DIAGNOSIS — Z125 Encounter for screening for malignant neoplasm of prostate: Secondary | ICD-10-CM | POA: Diagnosis not present

## 2016-12-20 DIAGNOSIS — Z7982 Long term (current) use of aspirin: Secondary | ICD-10-CM | POA: Diagnosis not present

## 2016-12-20 DIAGNOSIS — S0990XA Unspecified injury of head, initial encounter: Secondary | ICD-10-CM | POA: Diagnosis not present

## 2016-12-20 DIAGNOSIS — F0390 Unspecified dementia without behavioral disturbance: Secondary | ICD-10-CM | POA: Diagnosis not present

## 2016-12-20 DIAGNOSIS — Z043 Encounter for examination and observation following other accident: Secondary | ICD-10-CM | POA: Diagnosis not present

## 2016-12-20 DIAGNOSIS — Z9181 History of falling: Secondary | ICD-10-CM | POA: Diagnosis not present

## 2016-12-20 DIAGNOSIS — N183 Chronic kidney disease, stage 3 (moderate): Secondary | ICD-10-CM | POA: Diagnosis not present

## 2016-12-20 DIAGNOSIS — Z8673 Personal history of transient ischemic attack (TIA), and cerebral infarction without residual deficits: Secondary | ICD-10-CM | POA: Diagnosis not present

## 2016-12-20 DIAGNOSIS — G301 Alzheimer's disease with late onset: Secondary | ICD-10-CM | POA: Diagnosis not present

## 2016-12-20 DIAGNOSIS — Z888 Allergy status to other drugs, medicaments and biological substances status: Secondary | ICD-10-CM | POA: Diagnosis not present

## 2016-12-20 DIAGNOSIS — W010XXA Fall on same level from slipping, tripping and stumbling without subsequent striking against object, initial encounter: Secondary | ICD-10-CM | POA: Diagnosis not present

## 2016-12-20 DIAGNOSIS — Z79899 Other long term (current) drug therapy: Secondary | ICD-10-CM | POA: Diagnosis not present

## 2016-12-21 DIAGNOSIS — G301 Alzheimer's disease with late onset: Secondary | ICD-10-CM | POA: Diagnosis not present

## 2016-12-21 DIAGNOSIS — F0280 Dementia in other diseases classified elsewhere without behavioral disturbance: Secondary | ICD-10-CM | POA: Diagnosis not present

## 2016-12-21 DIAGNOSIS — I129 Hypertensive chronic kidney disease with stage 1 through stage 4 chronic kidney disease, or unspecified chronic kidney disease: Secondary | ICD-10-CM | POA: Diagnosis not present

## 2016-12-21 DIAGNOSIS — N183 Chronic kidney disease, stage 3 (moderate): Secondary | ICD-10-CM | POA: Diagnosis not present

## 2016-12-21 DIAGNOSIS — E1122 Type 2 diabetes mellitus with diabetic chronic kidney disease: Secondary | ICD-10-CM | POA: Diagnosis not present

## 2016-12-21 DIAGNOSIS — G308 Other Alzheimer's disease: Secondary | ICD-10-CM | POA: Diagnosis not present

## 2016-12-21 DIAGNOSIS — Z9181 History of falling: Secondary | ICD-10-CM | POA: Diagnosis not present

## 2016-12-24 DIAGNOSIS — N183 Chronic kidney disease, stage 3 (moderate): Secondary | ICD-10-CM | POA: Diagnosis not present

## 2016-12-24 DIAGNOSIS — F0280 Dementia in other diseases classified elsewhere without behavioral disturbance: Secondary | ICD-10-CM | POA: Diagnosis not present

## 2016-12-24 DIAGNOSIS — I129 Hypertensive chronic kidney disease with stage 1 through stage 4 chronic kidney disease, or unspecified chronic kidney disease: Secondary | ICD-10-CM | POA: Diagnosis not present

## 2016-12-24 DIAGNOSIS — G301 Alzheimer's disease with late onset: Secondary | ICD-10-CM | POA: Diagnosis not present

## 2016-12-24 DIAGNOSIS — Z9181 History of falling: Secondary | ICD-10-CM | POA: Diagnosis not present

## 2016-12-24 DIAGNOSIS — E1122 Type 2 diabetes mellitus with diabetic chronic kidney disease: Secondary | ICD-10-CM | POA: Diagnosis not present

## 2016-12-25 DIAGNOSIS — N183 Chronic kidney disease, stage 3 (moderate): Secondary | ICD-10-CM | POA: Diagnosis not present

## 2016-12-25 DIAGNOSIS — E1122 Type 2 diabetes mellitus with diabetic chronic kidney disease: Secondary | ICD-10-CM | POA: Diagnosis not present

## 2016-12-25 DIAGNOSIS — F0280 Dementia in other diseases classified elsewhere without behavioral disturbance: Secondary | ICD-10-CM | POA: Diagnosis not present

## 2016-12-25 DIAGNOSIS — G301 Alzheimer's disease with late onset: Secondary | ICD-10-CM | POA: Diagnosis not present

## 2016-12-25 DIAGNOSIS — Z125 Encounter for screening for malignant neoplasm of prostate: Secondary | ICD-10-CM | POA: Diagnosis not present

## 2016-12-25 DIAGNOSIS — I129 Hypertensive chronic kidney disease with stage 1 through stage 4 chronic kidney disease, or unspecified chronic kidney disease: Secondary | ICD-10-CM | POA: Diagnosis not present

## 2016-12-25 DIAGNOSIS — Z9181 History of falling: Secondary | ICD-10-CM | POA: Diagnosis not present

## 2016-12-26 DIAGNOSIS — E1122 Type 2 diabetes mellitus with diabetic chronic kidney disease: Secondary | ICD-10-CM | POA: Diagnosis not present

## 2016-12-26 DIAGNOSIS — F0280 Dementia in other diseases classified elsewhere without behavioral disturbance: Secondary | ICD-10-CM | POA: Diagnosis not present

## 2016-12-26 DIAGNOSIS — G301 Alzheimer's disease with late onset: Secondary | ICD-10-CM | POA: Diagnosis not present

## 2016-12-26 DIAGNOSIS — N183 Chronic kidney disease, stage 3 (moderate): Secondary | ICD-10-CM | POA: Diagnosis not present

## 2016-12-26 DIAGNOSIS — I129 Hypertensive chronic kidney disease with stage 1 through stage 4 chronic kidney disease, or unspecified chronic kidney disease: Secondary | ICD-10-CM | POA: Diagnosis not present

## 2016-12-26 DIAGNOSIS — Z9181 History of falling: Secondary | ICD-10-CM | POA: Diagnosis not present

## 2016-12-27 DIAGNOSIS — Z9181 History of falling: Secondary | ICD-10-CM | POA: Diagnosis not present

## 2016-12-27 DIAGNOSIS — F0280 Dementia in other diseases classified elsewhere without behavioral disturbance: Secondary | ICD-10-CM | POA: Diagnosis not present

## 2016-12-27 DIAGNOSIS — E1122 Type 2 diabetes mellitus with diabetic chronic kidney disease: Secondary | ICD-10-CM | POA: Diagnosis not present

## 2016-12-27 DIAGNOSIS — I129 Hypertensive chronic kidney disease with stage 1 through stage 4 chronic kidney disease, or unspecified chronic kidney disease: Secondary | ICD-10-CM | POA: Diagnosis not present

## 2016-12-27 DIAGNOSIS — N183 Chronic kidney disease, stage 3 (moderate): Secondary | ICD-10-CM | POA: Diagnosis not present

## 2016-12-27 DIAGNOSIS — G301 Alzheimer's disease with late onset: Secondary | ICD-10-CM | POA: Diagnosis not present

## 2016-12-28 DIAGNOSIS — I129 Hypertensive chronic kidney disease with stage 1 through stage 4 chronic kidney disease, or unspecified chronic kidney disease: Secondary | ICD-10-CM | POA: Diagnosis not present

## 2016-12-28 DIAGNOSIS — I69818 Other symptoms and signs involving cognitive functions following other cerebrovascular disease: Secondary | ICD-10-CM | POA: Diagnosis not present

## 2016-12-28 DIAGNOSIS — G301 Alzheimer's disease with late onset: Secondary | ICD-10-CM | POA: Diagnosis not present

## 2016-12-28 DIAGNOSIS — Z9181 History of falling: Secondary | ICD-10-CM | POA: Diagnosis not present

## 2016-12-28 DIAGNOSIS — I10 Essential (primary) hypertension: Secondary | ICD-10-CM | POA: Diagnosis not present

## 2016-12-28 DIAGNOSIS — E1122 Type 2 diabetes mellitus with diabetic chronic kidney disease: Secondary | ICD-10-CM | POA: Diagnosis not present

## 2016-12-28 DIAGNOSIS — F0280 Dementia in other diseases classified elsewhere without behavioral disturbance: Secondary | ICD-10-CM | POA: Diagnosis not present

## 2016-12-28 DIAGNOSIS — N183 Chronic kidney disease, stage 3 (moderate): Secondary | ICD-10-CM | POA: Diagnosis not present

## 2017-01-01 DIAGNOSIS — Z9181 History of falling: Secondary | ICD-10-CM | POA: Diagnosis not present

## 2017-01-01 DIAGNOSIS — I129 Hypertensive chronic kidney disease with stage 1 through stage 4 chronic kidney disease, or unspecified chronic kidney disease: Secondary | ICD-10-CM | POA: Diagnosis not present

## 2017-01-01 DIAGNOSIS — E1122 Type 2 diabetes mellitus with diabetic chronic kidney disease: Secondary | ICD-10-CM | POA: Diagnosis not present

## 2017-01-01 DIAGNOSIS — F0280 Dementia in other diseases classified elsewhere without behavioral disturbance: Secondary | ICD-10-CM | POA: Diagnosis not present

## 2017-01-01 DIAGNOSIS — N183 Chronic kidney disease, stage 3 (moderate): Secondary | ICD-10-CM | POA: Diagnosis not present

## 2017-01-01 DIAGNOSIS — G301 Alzheimer's disease with late onset: Secondary | ICD-10-CM | POA: Diagnosis not present

## 2017-01-02 DIAGNOSIS — N183 Chronic kidney disease, stage 3 (moderate): Secondary | ICD-10-CM | POA: Diagnosis not present

## 2017-01-02 DIAGNOSIS — G301 Alzheimer's disease with late onset: Secondary | ICD-10-CM | POA: Diagnosis not present

## 2017-01-02 DIAGNOSIS — Z9181 History of falling: Secondary | ICD-10-CM | POA: Diagnosis not present

## 2017-01-02 DIAGNOSIS — I129 Hypertensive chronic kidney disease with stage 1 through stage 4 chronic kidney disease, or unspecified chronic kidney disease: Secondary | ICD-10-CM | POA: Diagnosis not present

## 2017-01-02 DIAGNOSIS — E1122 Type 2 diabetes mellitus with diabetic chronic kidney disease: Secondary | ICD-10-CM | POA: Diagnosis not present

## 2017-01-02 DIAGNOSIS — F0280 Dementia in other diseases classified elsewhere without behavioral disturbance: Secondary | ICD-10-CM | POA: Diagnosis not present

## 2017-01-03 DIAGNOSIS — G301 Alzheimer's disease with late onset: Secondary | ICD-10-CM | POA: Diagnosis not present

## 2017-01-03 DIAGNOSIS — E1122 Type 2 diabetes mellitus with diabetic chronic kidney disease: Secondary | ICD-10-CM | POA: Diagnosis not present

## 2017-01-03 DIAGNOSIS — F0280 Dementia in other diseases classified elsewhere without behavioral disturbance: Secondary | ICD-10-CM | POA: Diagnosis not present

## 2017-01-03 DIAGNOSIS — Z9181 History of falling: Secondary | ICD-10-CM | POA: Diagnosis not present

## 2017-01-03 DIAGNOSIS — N183 Chronic kidney disease, stage 3 (moderate): Secondary | ICD-10-CM | POA: Diagnosis not present

## 2017-01-03 DIAGNOSIS — I129 Hypertensive chronic kidney disease with stage 1 through stage 4 chronic kidney disease, or unspecified chronic kidney disease: Secondary | ICD-10-CM | POA: Diagnosis not present

## 2017-01-07 DIAGNOSIS — E1122 Type 2 diabetes mellitus with diabetic chronic kidney disease: Secondary | ICD-10-CM | POA: Diagnosis not present

## 2017-01-07 DIAGNOSIS — F0280 Dementia in other diseases classified elsewhere without behavioral disturbance: Secondary | ICD-10-CM | POA: Diagnosis not present

## 2017-01-07 DIAGNOSIS — G301 Alzheimer's disease with late onset: Secondary | ICD-10-CM | POA: Diagnosis not present

## 2017-01-07 DIAGNOSIS — Z9181 History of falling: Secondary | ICD-10-CM | POA: Diagnosis not present

## 2017-01-07 DIAGNOSIS — N183 Chronic kidney disease, stage 3 (moderate): Secondary | ICD-10-CM | POA: Diagnosis not present

## 2017-01-07 DIAGNOSIS — I129 Hypertensive chronic kidney disease with stage 1 through stage 4 chronic kidney disease, or unspecified chronic kidney disease: Secondary | ICD-10-CM | POA: Diagnosis not present

## 2017-01-09 DIAGNOSIS — G301 Alzheimer's disease with late onset: Secondary | ICD-10-CM | POA: Diagnosis not present

## 2017-01-09 DIAGNOSIS — E1122 Type 2 diabetes mellitus with diabetic chronic kidney disease: Secondary | ICD-10-CM | POA: Diagnosis not present

## 2017-01-09 DIAGNOSIS — N183 Chronic kidney disease, stage 3 (moderate): Secondary | ICD-10-CM | POA: Diagnosis not present

## 2017-01-09 DIAGNOSIS — Z9181 History of falling: Secondary | ICD-10-CM | POA: Diagnosis not present

## 2017-01-09 DIAGNOSIS — F0280 Dementia in other diseases classified elsewhere without behavioral disturbance: Secondary | ICD-10-CM | POA: Diagnosis not present

## 2017-01-09 DIAGNOSIS — I129 Hypertensive chronic kidney disease with stage 1 through stage 4 chronic kidney disease, or unspecified chronic kidney disease: Secondary | ICD-10-CM | POA: Diagnosis not present

## 2017-01-11 DIAGNOSIS — N183 Chronic kidney disease, stage 3 (moderate): Secondary | ICD-10-CM | POA: Diagnosis not present

## 2017-01-11 DIAGNOSIS — S30810A Abrasion of lower back and pelvis, initial encounter: Secondary | ICD-10-CM | POA: Diagnosis not present

## 2017-01-11 DIAGNOSIS — Z9181 History of falling: Secondary | ICD-10-CM | POA: Diagnosis not present

## 2017-01-11 DIAGNOSIS — G301 Alzheimer's disease with late onset: Secondary | ICD-10-CM | POA: Diagnosis not present

## 2017-01-11 DIAGNOSIS — E1122 Type 2 diabetes mellitus with diabetic chronic kidney disease: Secondary | ICD-10-CM | POA: Diagnosis not present

## 2017-01-11 DIAGNOSIS — I129 Hypertensive chronic kidney disease with stage 1 through stage 4 chronic kidney disease, or unspecified chronic kidney disease: Secondary | ICD-10-CM | POA: Diagnosis not present

## 2017-01-11 DIAGNOSIS — F0280 Dementia in other diseases classified elsewhere without behavioral disturbance: Secondary | ICD-10-CM | POA: Diagnosis not present

## 2017-01-11 DIAGNOSIS — R5381 Other malaise: Secondary | ICD-10-CM | POA: Diagnosis not present

## 2017-01-12 DIAGNOSIS — I129 Hypertensive chronic kidney disease with stage 1 through stage 4 chronic kidney disease, or unspecified chronic kidney disease: Secondary | ICD-10-CM | POA: Diagnosis not present

## 2017-01-12 DIAGNOSIS — Z9181 History of falling: Secondary | ICD-10-CM | POA: Diagnosis not present

## 2017-01-12 DIAGNOSIS — F0280 Dementia in other diseases classified elsewhere without behavioral disturbance: Secondary | ICD-10-CM | POA: Diagnosis not present

## 2017-01-12 DIAGNOSIS — N183 Chronic kidney disease, stage 3 (moderate): Secondary | ICD-10-CM | POA: Diagnosis not present

## 2017-01-12 DIAGNOSIS — G301 Alzheimer's disease with late onset: Secondary | ICD-10-CM | POA: Diagnosis not present

## 2017-01-12 DIAGNOSIS — E1122 Type 2 diabetes mellitus with diabetic chronic kidney disease: Secondary | ICD-10-CM | POA: Diagnosis not present

## 2017-01-16 DIAGNOSIS — G301 Alzheimer's disease with late onset: Secondary | ICD-10-CM | POA: Diagnosis not present

## 2017-01-16 DIAGNOSIS — N183 Chronic kidney disease, stage 3 (moderate): Secondary | ICD-10-CM | POA: Diagnosis not present

## 2017-01-16 DIAGNOSIS — F0280 Dementia in other diseases classified elsewhere without behavioral disturbance: Secondary | ICD-10-CM | POA: Diagnosis not present

## 2017-01-16 DIAGNOSIS — I129 Hypertensive chronic kidney disease with stage 1 through stage 4 chronic kidney disease, or unspecified chronic kidney disease: Secondary | ICD-10-CM | POA: Diagnosis not present

## 2017-01-16 DIAGNOSIS — Z9181 History of falling: Secondary | ICD-10-CM | POA: Diagnosis not present

## 2017-01-16 DIAGNOSIS — E1122 Type 2 diabetes mellitus with diabetic chronic kidney disease: Secondary | ICD-10-CM | POA: Diagnosis not present

## 2017-01-17 DIAGNOSIS — Z9181 History of falling: Secondary | ICD-10-CM | POA: Diagnosis not present

## 2017-01-17 DIAGNOSIS — G301 Alzheimer's disease with late onset: Secondary | ICD-10-CM | POA: Diagnosis not present

## 2017-01-17 DIAGNOSIS — E1122 Type 2 diabetes mellitus with diabetic chronic kidney disease: Secondary | ICD-10-CM | POA: Diagnosis not present

## 2017-01-17 DIAGNOSIS — I129 Hypertensive chronic kidney disease with stage 1 through stage 4 chronic kidney disease, or unspecified chronic kidney disease: Secondary | ICD-10-CM | POA: Diagnosis not present

## 2017-01-17 DIAGNOSIS — F0280 Dementia in other diseases classified elsewhere without behavioral disturbance: Secondary | ICD-10-CM | POA: Diagnosis not present

## 2017-01-17 DIAGNOSIS — N183 Chronic kidney disease, stage 3 (moderate): Secondary | ICD-10-CM | POA: Diagnosis not present

## 2017-01-18 DIAGNOSIS — F0280 Dementia in other diseases classified elsewhere without behavioral disturbance: Secondary | ICD-10-CM | POA: Diagnosis not present

## 2017-01-18 DIAGNOSIS — N183 Chronic kidney disease, stage 3 (moderate): Secondary | ICD-10-CM | POA: Diagnosis not present

## 2017-01-18 DIAGNOSIS — I10 Essential (primary) hypertension: Secondary | ICD-10-CM | POA: Diagnosis not present

## 2017-01-18 DIAGNOSIS — G301 Alzheimer's disease with late onset: Secondary | ICD-10-CM | POA: Diagnosis not present

## 2017-01-18 DIAGNOSIS — I129 Hypertensive chronic kidney disease with stage 1 through stage 4 chronic kidney disease, or unspecified chronic kidney disease: Secondary | ICD-10-CM | POA: Diagnosis not present

## 2017-01-18 DIAGNOSIS — E1122 Type 2 diabetes mellitus with diabetic chronic kidney disease: Secondary | ICD-10-CM | POA: Diagnosis not present

## 2017-01-18 DIAGNOSIS — Z9181 History of falling: Secondary | ICD-10-CM | POA: Diagnosis not present

## 2017-01-21 DIAGNOSIS — I129 Hypertensive chronic kidney disease with stage 1 through stage 4 chronic kidney disease, or unspecified chronic kidney disease: Secondary | ICD-10-CM | POA: Diagnosis not present

## 2017-01-21 DIAGNOSIS — F0280 Dementia in other diseases classified elsewhere without behavioral disturbance: Secondary | ICD-10-CM | POA: Diagnosis not present

## 2017-01-21 DIAGNOSIS — G301 Alzheimer's disease with late onset: Secondary | ICD-10-CM | POA: Diagnosis not present

## 2017-01-21 DIAGNOSIS — Z9181 History of falling: Secondary | ICD-10-CM | POA: Diagnosis not present

## 2017-01-21 DIAGNOSIS — N183 Chronic kidney disease, stage 3 (moderate): Secondary | ICD-10-CM | POA: Diagnosis not present

## 2017-01-21 DIAGNOSIS — E1122 Type 2 diabetes mellitus with diabetic chronic kidney disease: Secondary | ICD-10-CM | POA: Diagnosis not present

## 2017-01-23 DIAGNOSIS — R296 Repeated falls: Secondary | ICD-10-CM | POA: Diagnosis not present

## 2017-01-23 DIAGNOSIS — R454 Irritability and anger: Secondary | ICD-10-CM | POA: Diagnosis not present

## 2017-01-23 DIAGNOSIS — F0391 Unspecified dementia with behavioral disturbance: Secondary | ICD-10-CM | POA: Diagnosis not present

## 2017-01-23 DIAGNOSIS — R451 Restlessness and agitation: Secondary | ICD-10-CM | POA: Diagnosis not present

## 2017-01-24 ENCOUNTER — Ambulatory Visit: Payer: Medicare Other | Admitting: Family Medicine

## 2017-01-24 DIAGNOSIS — I129 Hypertensive chronic kidney disease with stage 1 through stage 4 chronic kidney disease, or unspecified chronic kidney disease: Secondary | ICD-10-CM | POA: Diagnosis not present

## 2017-01-24 DIAGNOSIS — N183 Chronic kidney disease, stage 3 (moderate): Secondary | ICD-10-CM | POA: Diagnosis not present

## 2017-01-24 DIAGNOSIS — Z9181 History of falling: Secondary | ICD-10-CM | POA: Diagnosis not present

## 2017-01-24 DIAGNOSIS — G301 Alzheimer's disease with late onset: Secondary | ICD-10-CM | POA: Diagnosis not present

## 2017-01-24 DIAGNOSIS — F0280 Dementia in other diseases classified elsewhere without behavioral disturbance: Secondary | ICD-10-CM | POA: Diagnosis not present

## 2017-01-24 DIAGNOSIS — E1122 Type 2 diabetes mellitus with diabetic chronic kidney disease: Secondary | ICD-10-CM | POA: Diagnosis not present

## 2017-01-25 DIAGNOSIS — N183 Chronic kidney disease, stage 3 (moderate): Secondary | ICD-10-CM | POA: Diagnosis not present

## 2017-01-25 DIAGNOSIS — E1122 Type 2 diabetes mellitus with diabetic chronic kidney disease: Secondary | ICD-10-CM | POA: Diagnosis not present

## 2017-01-25 DIAGNOSIS — F0280 Dementia in other diseases classified elsewhere without behavioral disturbance: Secondary | ICD-10-CM | POA: Diagnosis not present

## 2017-01-25 DIAGNOSIS — I129 Hypertensive chronic kidney disease with stage 1 through stage 4 chronic kidney disease, or unspecified chronic kidney disease: Secondary | ICD-10-CM | POA: Diagnosis not present

## 2017-01-25 DIAGNOSIS — G301 Alzheimer's disease with late onset: Secondary | ICD-10-CM | POA: Diagnosis not present

## 2017-01-25 DIAGNOSIS — Z9181 History of falling: Secondary | ICD-10-CM | POA: Diagnosis not present

## 2017-01-28 DIAGNOSIS — E1122 Type 2 diabetes mellitus with diabetic chronic kidney disease: Secondary | ICD-10-CM | POA: Diagnosis not present

## 2017-01-28 DIAGNOSIS — F0280 Dementia in other diseases classified elsewhere without behavioral disturbance: Secondary | ICD-10-CM | POA: Diagnosis not present

## 2017-01-28 DIAGNOSIS — I129 Hypertensive chronic kidney disease with stage 1 through stage 4 chronic kidney disease, or unspecified chronic kidney disease: Secondary | ICD-10-CM | POA: Diagnosis not present

## 2017-01-28 DIAGNOSIS — Z9181 History of falling: Secondary | ICD-10-CM | POA: Diagnosis not present

## 2017-01-28 DIAGNOSIS — N183 Chronic kidney disease, stage 3 (moderate): Secondary | ICD-10-CM | POA: Diagnosis not present

## 2017-01-28 DIAGNOSIS — G301 Alzheimer's disease with late onset: Secondary | ICD-10-CM | POA: Diagnosis not present

## 2017-01-29 DIAGNOSIS — E1122 Type 2 diabetes mellitus with diabetic chronic kidney disease: Secondary | ICD-10-CM | POA: Diagnosis not present

## 2017-01-29 DIAGNOSIS — I129 Hypertensive chronic kidney disease with stage 1 through stage 4 chronic kidney disease, or unspecified chronic kidney disease: Secondary | ICD-10-CM | POA: Diagnosis not present

## 2017-01-29 DIAGNOSIS — F0280 Dementia in other diseases classified elsewhere without behavioral disturbance: Secondary | ICD-10-CM | POA: Diagnosis not present

## 2017-01-29 DIAGNOSIS — N183 Chronic kidney disease, stage 3 (moderate): Secondary | ICD-10-CM | POA: Diagnosis not present

## 2017-01-29 DIAGNOSIS — Z9181 History of falling: Secondary | ICD-10-CM | POA: Diagnosis not present

## 2017-01-29 DIAGNOSIS — G301 Alzheimer's disease with late onset: Secondary | ICD-10-CM | POA: Diagnosis not present

## 2017-02-07 DIAGNOSIS — R451 Restlessness and agitation: Secondary | ICD-10-CM | POA: Diagnosis not present

## 2017-02-07 DIAGNOSIS — F4320 Adjustment disorder, unspecified: Secondary | ICD-10-CM | POA: Diagnosis not present

## 2017-02-07 DIAGNOSIS — F0391 Unspecified dementia with behavioral disturbance: Secondary | ICD-10-CM | POA: Diagnosis not present

## 2017-02-07 DIAGNOSIS — R454 Irritability and anger: Secondary | ICD-10-CM | POA: Diagnosis not present

## 2017-02-08 DIAGNOSIS — R634 Abnormal weight loss: Secondary | ICD-10-CM | POA: Diagnosis not present

## 2017-02-08 DIAGNOSIS — I10 Essential (primary) hypertension: Secondary | ICD-10-CM | POA: Diagnosis not present

## 2017-02-19 DIAGNOSIS — F0281 Dementia in other diseases classified elsewhere with behavioral disturbance: Secondary | ICD-10-CM | POA: Diagnosis not present

## 2017-02-19 DIAGNOSIS — I10 Essential (primary) hypertension: Secondary | ICD-10-CM | POA: Diagnosis not present

## 2017-02-22 DIAGNOSIS — R4587 Impulsiveness: Secondary | ICD-10-CM | POA: Diagnosis not present

## 2017-02-26 DIAGNOSIS — R6889 Other general symptoms and signs: Secondary | ICD-10-CM | POA: Diagnosis not present

## 2017-02-26 DIAGNOSIS — R7309 Other abnormal glucose: Secondary | ICD-10-CM | POA: Diagnosis not present

## 2017-02-26 DIAGNOSIS — R4587 Impulsiveness: Secondary | ICD-10-CM | POA: Diagnosis not present

## 2017-02-26 DIAGNOSIS — R7989 Other specified abnormal findings of blood chemistry: Secondary | ICD-10-CM | POA: Diagnosis not present

## 2017-03-04 ENCOUNTER — Ambulatory Visit: Payer: Medicare Other | Admitting: Podiatry

## 2017-03-04 DIAGNOSIS — M25571 Pain in right ankle and joints of right foot: Secondary | ICD-10-CM | POA: Diagnosis not present

## 2017-03-07 DIAGNOSIS — R296 Repeated falls: Secondary | ICD-10-CM | POA: Diagnosis not present

## 2017-03-07 DIAGNOSIS — F4320 Adjustment disorder, unspecified: Secondary | ICD-10-CM | POA: Diagnosis not present

## 2017-03-07 DIAGNOSIS — F0391 Unspecified dementia with behavioral disturbance: Secondary | ICD-10-CM | POA: Diagnosis not present

## 2017-03-07 DIAGNOSIS — R451 Restlessness and agitation: Secondary | ICD-10-CM | POA: Diagnosis not present

## 2017-03-07 DIAGNOSIS — R454 Irritability and anger: Secondary | ICD-10-CM | POA: Diagnosis not present

## 2017-03-08 DIAGNOSIS — I10 Essential (primary) hypertension: Secondary | ICD-10-CM | POA: Diagnosis not present

## 2017-03-08 DIAGNOSIS — F0281 Dementia in other diseases classified elsewhere with behavioral disturbance: Secondary | ICD-10-CM | POA: Diagnosis not present

## 2017-03-12 DIAGNOSIS — R4587 Impulsiveness: Secondary | ICD-10-CM | POA: Diagnosis not present

## 2017-03-14 DIAGNOSIS — Z5181 Encounter for therapeutic drug level monitoring: Secondary | ICD-10-CM | POA: Diagnosis not present

## 2017-03-15 DIAGNOSIS — R635 Abnormal weight gain: Secondary | ICD-10-CM | POA: Diagnosis not present

## 2017-03-21 ENCOUNTER — Ambulatory Visit (INDEPENDENT_AMBULATORY_CARE_PROVIDER_SITE_OTHER): Payer: Medicare Other | Admitting: Podiatry

## 2017-03-21 ENCOUNTER — Encounter: Payer: Self-pay | Admitting: Podiatry

## 2017-03-21 DIAGNOSIS — E0842 Diabetes mellitus due to underlying condition with diabetic polyneuropathy: Secondary | ICD-10-CM | POA: Diagnosis not present

## 2017-03-21 DIAGNOSIS — B351 Tinea unguium: Secondary | ICD-10-CM

## 2017-03-21 DIAGNOSIS — I739 Peripheral vascular disease, unspecified: Secondary | ICD-10-CM

## 2017-03-21 DIAGNOSIS — L89511 Pressure ulcer of right ankle, stage 1: Secondary | ICD-10-CM | POA: Diagnosis not present

## 2017-03-21 NOTE — Patient Instructions (Signed)
Seen for hypertrophic nails. Noted of pre ulcerative skin lesion right lateral ankle. All nails debrided. Aperture pad placed and antibiotic ointment dressing applied. Instructed to change dressing after each bath. Return in one month. Remove padding the day before returning to office.

## 2017-03-21 NOTE — Progress Notes (Signed)
Subjective: 83 y.o. year old male patient presents accompanied by care taker requesting toe nails trimmed. He is wheel chair bound and unable to move himself.   Objective: Dermatologic: Hypertrophic nails x 10. Pre ulcerative discolorated right lateral malleoli skin with irregular skin surface. No opening yet. Vascular: Pedal pulses are not palpable. Both feet are cool and edematous. Orthopedic: Very stiff lower limbs foot and ankle. Cavus foot with Valgus rotated great toe. Neurologic: Failed to respond to sensory testing with Monofilament wire.  Assessment: Dystrophic mycotic nails x 10. Peripheral neuropathy. Pre ulcerative pressure lesion right lateral malleoli.  Treatment: All mycotic nails debrided.  1/4" Aperture pad placed with Amerigel ointment dressing. Instructed to keep it covered with antibiotic ointment dressing and change after each bath. Return in one month to follow up on right lateral ankle lesion.

## 2017-03-22 DIAGNOSIS — R0989 Other specified symptoms and signs involving the circulatory and respiratory systems: Secondary | ICD-10-CM | POA: Diagnosis not present

## 2017-03-22 DIAGNOSIS — L988 Other specified disorders of the skin and subcutaneous tissue: Secondary | ICD-10-CM | POA: Diagnosis not present

## 2017-03-24 DIAGNOSIS — R9430 Abnormal result of cardiovascular function study, unspecified: Secondary | ICD-10-CM | POA: Diagnosis not present

## 2017-04-04 DIAGNOSIS — F4320 Adjustment disorder, unspecified: Secondary | ICD-10-CM | POA: Diagnosis not present

## 2017-04-04 DIAGNOSIS — R296 Repeated falls: Secondary | ICD-10-CM | POA: Diagnosis not present

## 2017-04-04 DIAGNOSIS — R451 Restlessness and agitation: Secondary | ICD-10-CM | POA: Diagnosis not present

## 2017-04-04 DIAGNOSIS — F0391 Unspecified dementia with behavioral disturbance: Secondary | ICD-10-CM | POA: Diagnosis not present

## 2017-04-04 DIAGNOSIS — R454 Irritability and anger: Secondary | ICD-10-CM | POA: Diagnosis not present

## 2017-04-09 DIAGNOSIS — Z5181 Encounter for therapeutic drug level monitoring: Secondary | ICD-10-CM | POA: Diagnosis not present

## 2017-04-09 DIAGNOSIS — F0281 Dementia in other diseases classified elsewhere with behavioral disturbance: Secondary | ICD-10-CM | POA: Diagnosis not present

## 2017-04-09 DIAGNOSIS — I69354 Hemiplegia and hemiparesis following cerebral infarction affecting left non-dominant side: Secondary | ICD-10-CM | POA: Diagnosis not present

## 2017-04-09 DIAGNOSIS — R569 Unspecified convulsions: Secondary | ICD-10-CM | POA: Diagnosis not present

## 2017-04-09 DIAGNOSIS — I10 Essential (primary) hypertension: Secondary | ICD-10-CM | POA: Diagnosis not present

## 2017-04-24 ENCOUNTER — Ambulatory Visit: Payer: Medicare Other | Admitting: Podiatry

## 2017-04-26 DIAGNOSIS — I10 Essential (primary) hypertension: Secondary | ICD-10-CM | POA: Diagnosis not present

## 2017-04-26 DIAGNOSIS — F0281 Dementia in other diseases classified elsewhere with behavioral disturbance: Secondary | ICD-10-CM | POA: Diagnosis not present

## 2017-05-01 ENCOUNTER — Telehealth: Payer: Self-pay | Admitting: *Deleted

## 2017-05-01 NOTE — Telephone Encounter (Signed)
Spoke with pt wife, the patient is needing CTA to follow up on thoracic aneurysm and also a follow up appointment with dr Stanford Breed in may in [place]. The patient is currently in a nursing facility and she needs to check on transportation. She will give me a call back next week to get things arranged.

## 2017-05-09 DIAGNOSIS — F0391 Unspecified dementia with behavioral disturbance: Secondary | ICD-10-CM | POA: Diagnosis not present

## 2017-05-09 DIAGNOSIS — F4320 Adjustment disorder, unspecified: Secondary | ICD-10-CM | POA: Diagnosis not present

## 2017-05-09 DIAGNOSIS — R296 Repeated falls: Secondary | ICD-10-CM | POA: Diagnosis not present

## 2017-05-14 NOTE — Telephone Encounter (Signed)
Spoke with pt wife, the patient is being moved to a skilled nursing facility and things are crazy right now. Explained if the patient does not want to do this testing anymore to let us know. She reports the patient is unable to make his own decisions. She is going to talk to the facility and find out what they think and then let me know.

## 2017-05-28 DIAGNOSIS — I10 Essential (primary) hypertension: Secondary | ICD-10-CM | POA: Diagnosis not present

## 2017-05-28 DIAGNOSIS — R4587 Impulsiveness: Secondary | ICD-10-CM | POA: Diagnosis not present

## 2017-05-28 DIAGNOSIS — F0281 Dementia in other diseases classified elsewhere with behavioral disturbance: Secondary | ICD-10-CM | POA: Diagnosis not present

## 2017-06-05 DIAGNOSIS — F0391 Unspecified dementia with behavioral disturbance: Secondary | ICD-10-CM | POA: Diagnosis not present

## 2017-06-05 DIAGNOSIS — F4320 Adjustment disorder, unspecified: Secondary | ICD-10-CM | POA: Diagnosis not present

## 2017-06-05 DIAGNOSIS — R296 Repeated falls: Secondary | ICD-10-CM | POA: Diagnosis not present

## 2017-06-18 DIAGNOSIS — R634 Abnormal weight loss: Secondary | ICD-10-CM | POA: Diagnosis not present

## 2017-06-21 DIAGNOSIS — R4587 Impulsiveness: Secondary | ICD-10-CM | POA: Diagnosis not present

## 2017-06-23 DIAGNOSIS — R269 Unspecified abnormalities of gait and mobility: Secondary | ICD-10-CM | POA: Diagnosis not present

## 2017-06-23 DIAGNOSIS — Z7984 Long term (current) use of oral hypoglycemic drugs: Secondary | ICD-10-CM | POA: Diagnosis not present

## 2017-06-23 DIAGNOSIS — I129 Hypertensive chronic kidney disease with stage 1 through stage 4 chronic kidney disease, or unspecified chronic kidney disease: Secondary | ICD-10-CM | POA: Diagnosis not present

## 2017-06-23 DIAGNOSIS — R4182 Altered mental status, unspecified: Secondary | ICD-10-CM | POA: Diagnosis not present

## 2017-06-23 DIAGNOSIS — I10 Essential (primary) hypertension: Secondary | ICD-10-CM | POA: Diagnosis not present

## 2017-06-23 DIAGNOSIS — R52 Pain, unspecified: Secondary | ICD-10-CM | POA: Diagnosis not present

## 2017-06-23 DIAGNOSIS — F0390 Unspecified dementia without behavioral disturbance: Secondary | ICD-10-CM | POA: Diagnosis not present

## 2017-06-23 DIAGNOSIS — Z888 Allergy status to other drugs, medicaments and biological substances status: Secondary | ICD-10-CM | POA: Diagnosis not present

## 2017-06-23 DIAGNOSIS — N183 Chronic kidney disease, stage 3 (moderate): Secondary | ICD-10-CM | POA: Diagnosis not present

## 2017-06-23 DIAGNOSIS — Z79899 Other long term (current) drug therapy: Secondary | ICD-10-CM | POA: Diagnosis not present

## 2017-06-23 DIAGNOSIS — E1122 Type 2 diabetes mellitus with diabetic chronic kidney disease: Secondary | ICD-10-CM | POA: Diagnosis not present

## 2017-06-23 DIAGNOSIS — S098XXA Other specified injuries of head, initial encounter: Secondary | ICD-10-CM | POA: Diagnosis not present

## 2017-06-23 DIAGNOSIS — Z7982 Long term (current) use of aspirin: Secondary | ICD-10-CM | POA: Diagnosis not present

## 2017-06-23 DIAGNOSIS — S0990XA Unspecified injury of head, initial encounter: Secondary | ICD-10-CM | POA: Diagnosis not present

## 2017-06-23 DIAGNOSIS — T1490XA Injury, unspecified, initial encounter: Secondary | ICD-10-CM | POA: Diagnosis not present

## 2017-07-08 DIAGNOSIS — I10 Essential (primary) hypertension: Secondary | ICD-10-CM | POA: Diagnosis not present

## 2017-07-08 DIAGNOSIS — R4701 Aphasia: Secondary | ICD-10-CM | POA: Diagnosis not present

## 2017-07-08 DIAGNOSIS — F339 Major depressive disorder, recurrent, unspecified: Secondary | ICD-10-CM | POA: Diagnosis not present

## 2017-07-08 DIAGNOSIS — R0989 Other specified symptoms and signs involving the circulatory and respiratory systems: Secondary | ICD-10-CM | POA: Diagnosis not present

## 2017-07-08 DIAGNOSIS — N183 Chronic kidney disease, stage 3 (moderate): Secondary | ICD-10-CM | POA: Diagnosis not present

## 2017-07-08 DIAGNOSIS — F0280 Dementia in other diseases classified elsewhere without behavioral disturbance: Secondary | ICD-10-CM | POA: Diagnosis present

## 2017-07-08 DIAGNOSIS — G9341 Metabolic encephalopathy: Secondary | ICD-10-CM | POA: Diagnosis not present

## 2017-07-08 DIAGNOSIS — N390 Urinary tract infection, site not specified: Secondary | ICD-10-CM | POA: Diagnosis not present

## 2017-07-08 DIAGNOSIS — A0472 Enterocolitis due to Clostridium difficile, not specified as recurrent: Secondary | ICD-10-CM | POA: Diagnosis not present

## 2017-07-08 DIAGNOSIS — L03116 Cellulitis of left lower limb: Secondary | ICD-10-CM | POA: Diagnosis not present

## 2017-07-08 DIAGNOSIS — M1A9XX0 Chronic gout, unspecified, without tophus (tophi): Secondary | ICD-10-CM | POA: Diagnosis not present

## 2017-07-08 DIAGNOSIS — Z8673 Personal history of transient ischemic attack (TIA), and cerebral infarction without residual deficits: Secondary | ICD-10-CM | POA: Diagnosis not present

## 2017-07-08 DIAGNOSIS — A4151 Sepsis due to Escherichia coli [E. coli]: Secondary | ICD-10-CM | POA: Diagnosis not present

## 2017-07-08 DIAGNOSIS — G309 Alzheimer's disease, unspecified: Secondary | ICD-10-CM | POA: Diagnosis not present

## 2017-07-08 DIAGNOSIS — R1013 Epigastric pain: Secondary | ICD-10-CM | POA: Diagnosis not present

## 2017-07-08 DIAGNOSIS — Z743 Need for continuous supervision: Secondary | ICD-10-CM | POA: Diagnosis not present

## 2017-07-08 DIAGNOSIS — F419 Anxiety disorder, unspecified: Secondary | ICD-10-CM | POA: Diagnosis present

## 2017-07-08 DIAGNOSIS — Z7982 Long term (current) use of aspirin: Secondary | ICD-10-CM | POA: Diagnosis not present

## 2017-07-08 DIAGNOSIS — R509 Fever, unspecified: Secondary | ICD-10-CM | POA: Diagnosis not present

## 2017-07-08 DIAGNOSIS — Z79899 Other long term (current) drug therapy: Secondary | ICD-10-CM | POA: Diagnosis not present

## 2017-07-08 DIAGNOSIS — I2510 Atherosclerotic heart disease of native coronary artery without angina pectoris: Secondary | ICD-10-CM | POA: Diagnosis present

## 2017-07-08 DIAGNOSIS — F329 Major depressive disorder, single episode, unspecified: Secondary | ICD-10-CM | POA: Diagnosis present

## 2017-07-08 DIAGNOSIS — E119 Type 2 diabetes mellitus without complications: Secondary | ICD-10-CM | POA: Diagnosis not present

## 2017-07-08 DIAGNOSIS — R4182 Altered mental status, unspecified: Secondary | ICD-10-CM | POA: Diagnosis not present

## 2017-07-08 DIAGNOSIS — R402441 Other coma, without documented Glasgow coma scale score, or with partial score reported, in the field [EMT or ambulance]: Secondary | ICD-10-CM | POA: Diagnosis not present

## 2017-07-08 DIAGNOSIS — I129 Hypertensive chronic kidney disease with stage 1 through stage 4 chronic kidney disease, or unspecified chronic kidney disease: Secondary | ICD-10-CM | POA: Diagnosis present

## 2017-07-08 DIAGNOSIS — A0471 Enterocolitis due to Clostridium difficile, recurrent: Secondary | ICD-10-CM | POA: Diagnosis not present

## 2017-07-08 DIAGNOSIS — L03115 Cellulitis of right lower limb: Secondary | ICD-10-CM | POA: Diagnosis not present

## 2017-07-08 DIAGNOSIS — J9811 Atelectasis: Secondary | ICD-10-CM | POA: Diagnosis not present

## 2017-07-08 DIAGNOSIS — E538 Deficiency of other specified B group vitamins: Secondary | ICD-10-CM | POA: Diagnosis not present

## 2017-07-08 DIAGNOSIS — N3000 Acute cystitis without hematuria: Secondary | ICD-10-CM | POA: Diagnosis not present

## 2017-07-08 DIAGNOSIS — A419 Sepsis, unspecified organism: Secondary | ICD-10-CM | POA: Diagnosis not present

## 2017-07-08 DIAGNOSIS — I714 Abdominal aortic aneurysm, without rupture: Secondary | ICD-10-CM | POA: Diagnosis not present

## 2017-07-08 DIAGNOSIS — G300 Alzheimer's disease with early onset: Secondary | ICD-10-CM | POA: Diagnosis not present

## 2017-07-08 DIAGNOSIS — R41841 Cognitive communication deficit: Secondary | ICD-10-CM | POA: Diagnosis not present

## 2017-07-08 DIAGNOSIS — M109 Gout, unspecified: Secondary | ICD-10-CM | POA: Diagnosis present

## 2017-07-08 DIAGNOSIS — R410 Disorientation, unspecified: Secondary | ICD-10-CM | POA: Diagnosis not present

## 2017-07-08 DIAGNOSIS — Z66 Do not resuscitate: Secondary | ICD-10-CM | POA: Diagnosis present

## 2017-07-08 DIAGNOSIS — F0281 Dementia in other diseases classified elsewhere with behavioral disturbance: Secondary | ICD-10-CM | POA: Diagnosis not present

## 2017-07-08 DIAGNOSIS — M6281 Muscle weakness (generalized): Secondary | ICD-10-CM | POA: Diagnosis not present

## 2017-07-08 DIAGNOSIS — E1122 Type 2 diabetes mellitus with diabetic chronic kidney disease: Secondary | ICD-10-CM | POA: Diagnosis present

## 2017-07-15 DIAGNOSIS — M6281 Muscle weakness (generalized): Secondary | ICD-10-CM | POA: Diagnosis not present

## 2017-07-15 DIAGNOSIS — Z743 Need for continuous supervision: Secondary | ICD-10-CM | POA: Diagnosis not present

## 2017-07-15 DIAGNOSIS — A0472 Enterocolitis due to Clostridium difficile, not specified as recurrent: Secondary | ICD-10-CM | POA: Diagnosis not present

## 2017-07-15 DIAGNOSIS — A419 Sepsis, unspecified organism: Secondary | ICD-10-CM | POA: Diagnosis not present

## 2017-07-15 DIAGNOSIS — L03116 Cellulitis of left lower limb: Secondary | ICD-10-CM | POA: Diagnosis not present

## 2017-07-15 DIAGNOSIS — F0151 Vascular dementia with behavioral disturbance: Secondary | ICD-10-CM | POA: Diagnosis not present

## 2017-07-15 DIAGNOSIS — M109 Gout, unspecified: Secondary | ICD-10-CM | POA: Diagnosis not present

## 2017-07-15 DIAGNOSIS — F29 Unspecified psychosis not due to a substance or known physiological condition: Secondary | ICD-10-CM | POA: Diagnosis not present

## 2017-07-15 DIAGNOSIS — Z8619 Personal history of other infectious and parasitic diseases: Secondary | ICD-10-CM | POA: Diagnosis not present

## 2017-07-15 DIAGNOSIS — B9620 Unspecified Escherichia coli [E. coli] as the cause of diseases classified elsewhere: Secondary | ICD-10-CM | POA: Diagnosis not present

## 2017-07-15 DIAGNOSIS — F419 Anxiety disorder, unspecified: Secondary | ICD-10-CM | POA: Diagnosis not present

## 2017-07-15 DIAGNOSIS — F329 Major depressive disorder, single episode, unspecified: Secondary | ICD-10-CM | POA: Diagnosis not present

## 2017-07-15 DIAGNOSIS — E119 Type 2 diabetes mellitus without complications: Secondary | ICD-10-CM | POA: Diagnosis not present

## 2017-07-15 DIAGNOSIS — G309 Alzheimer's disease, unspecified: Secondary | ICD-10-CM | POA: Diagnosis not present

## 2017-07-15 DIAGNOSIS — I10 Essential (primary) hypertension: Secondary | ICD-10-CM | POA: Diagnosis not present

## 2017-07-15 DIAGNOSIS — R41841 Cognitive communication deficit: Secondary | ICD-10-CM | POA: Diagnosis not present

## 2017-07-15 DIAGNOSIS — A4151 Sepsis due to Escherichia coli [E. coli]: Secondary | ICD-10-CM | POA: Diagnosis not present

## 2017-07-15 DIAGNOSIS — A0471 Enterocolitis due to Clostridium difficile, recurrent: Secondary | ICD-10-CM | POA: Diagnosis not present

## 2017-07-15 DIAGNOSIS — I2510 Atherosclerotic heart disease of native coronary artery without angina pectoris: Secondary | ICD-10-CM | POA: Diagnosis not present

## 2017-07-15 DIAGNOSIS — N3000 Acute cystitis without hematuria: Secondary | ICD-10-CM | POA: Diagnosis not present

## 2017-07-15 DIAGNOSIS — E538 Deficiency of other specified B group vitamins: Secondary | ICD-10-CM | POA: Diagnosis not present

## 2017-07-15 DIAGNOSIS — N183 Chronic kidney disease, stage 3 (moderate): Secondary | ICD-10-CM | POA: Diagnosis not present

## 2017-07-15 DIAGNOSIS — G300 Alzheimer's disease with early onset: Secondary | ICD-10-CM | POA: Diagnosis not present

## 2017-07-15 DIAGNOSIS — F0280 Dementia in other diseases classified elsewhere without behavioral disturbance: Secondary | ICD-10-CM | POA: Diagnosis not present

## 2017-07-15 DIAGNOSIS — F339 Major depressive disorder, recurrent, unspecified: Secondary | ICD-10-CM | POA: Diagnosis not present

## 2017-07-15 DIAGNOSIS — F0281 Dementia in other diseases classified elsewhere with behavioral disturbance: Secondary | ICD-10-CM | POA: Diagnosis not present

## 2017-07-15 DIAGNOSIS — N390 Urinary tract infection, site not specified: Secondary | ICD-10-CM | POA: Diagnosis not present

## 2017-07-15 DIAGNOSIS — R4701 Aphasia: Secondary | ICD-10-CM | POA: Diagnosis not present

## 2017-07-15 DIAGNOSIS — G9341 Metabolic encephalopathy: Secondary | ICD-10-CM | POA: Diagnosis not present

## 2017-07-15 DIAGNOSIS — Z8673 Personal history of transient ischemic attack (TIA), and cerebral infarction without residual deficits: Secondary | ICD-10-CM | POA: Diagnosis not present

## 2017-07-17 DIAGNOSIS — B9620 Unspecified Escherichia coli [E. coli] as the cause of diseases classified elsewhere: Secondary | ICD-10-CM | POA: Diagnosis not present

## 2017-07-17 DIAGNOSIS — N390 Urinary tract infection, site not specified: Secondary | ICD-10-CM | POA: Diagnosis not present

## 2017-07-17 DIAGNOSIS — A419 Sepsis, unspecified organism: Secondary | ICD-10-CM | POA: Diagnosis not present

## 2017-07-17 DIAGNOSIS — G9341 Metabolic encephalopathy: Secondary | ICD-10-CM | POA: Diagnosis not present

## 2017-07-22 DIAGNOSIS — F0280 Dementia in other diseases classified elsewhere without behavioral disturbance: Secondary | ICD-10-CM | POA: Diagnosis not present

## 2017-07-22 DIAGNOSIS — A419 Sepsis, unspecified organism: Secondary | ICD-10-CM | POA: Diagnosis not present

## 2017-07-22 DIAGNOSIS — Z8619 Personal history of other infectious and parasitic diseases: Secondary | ICD-10-CM | POA: Diagnosis not present

## 2017-07-22 DIAGNOSIS — E119 Type 2 diabetes mellitus without complications: Secondary | ICD-10-CM | POA: Diagnosis not present

## 2017-07-22 DIAGNOSIS — N183 Chronic kidney disease, stage 3 (moderate): Secondary | ICD-10-CM | POA: Diagnosis not present

## 2017-07-22 DIAGNOSIS — G309 Alzheimer's disease, unspecified: Secondary | ICD-10-CM | POA: Diagnosis not present

## 2017-07-22 DIAGNOSIS — Z8673 Personal history of transient ischemic attack (TIA), and cerebral infarction without residual deficits: Secondary | ICD-10-CM | POA: Diagnosis not present

## 2017-07-22 DIAGNOSIS — I10 Essential (primary) hypertension: Secondary | ICD-10-CM | POA: Diagnosis not present

## 2017-07-22 DIAGNOSIS — N390 Urinary tract infection, site not specified: Secondary | ICD-10-CM | POA: Diagnosis not present

## 2017-08-03 DIAGNOSIS — L03818 Cellulitis of other sites: Secondary | ICD-10-CM | POA: Diagnosis not present

## 2017-08-03 DIAGNOSIS — E790 Hyperuricemia without signs of inflammatory arthritis and tophaceous disease: Secondary | ICD-10-CM | POA: Diagnosis not present

## 2017-08-03 DIAGNOSIS — I679 Cerebrovascular disease, unspecified: Secondary | ICD-10-CM | POA: Diagnosis not present

## 2017-08-03 DIAGNOSIS — F0390 Unspecified dementia without behavioral disturbance: Secondary | ICD-10-CM | POA: Diagnosis not present

## 2017-08-05 DIAGNOSIS — N390 Urinary tract infection, site not specified: Secondary | ICD-10-CM | POA: Diagnosis not present

## 2017-08-06 DIAGNOSIS — F0150 Vascular dementia without behavioral disturbance: Secondary | ICD-10-CM | POA: Diagnosis not present

## 2017-08-06 DIAGNOSIS — R269 Unspecified abnormalities of gait and mobility: Secondary | ICD-10-CM | POA: Diagnosis not present

## 2017-08-06 DIAGNOSIS — I10 Essential (primary) hypertension: Secondary | ICD-10-CM | POA: Diagnosis not present

## 2017-08-06 DIAGNOSIS — N3000 Acute cystitis without hematuria: Secondary | ICD-10-CM | POA: Diagnosis not present

## 2017-08-07 DIAGNOSIS — Z8673 Personal history of transient ischemic attack (TIA), and cerebral infarction without residual deficits: Secondary | ICD-10-CM | POA: Diagnosis not present

## 2017-08-07 DIAGNOSIS — I129 Hypertensive chronic kidney disease with stage 1 through stage 4 chronic kidney disease, or unspecified chronic kidney disease: Secondary | ICD-10-CM | POA: Diagnosis not present

## 2017-08-07 DIAGNOSIS — E1122 Type 2 diabetes mellitus with diabetic chronic kidney disease: Secondary | ICD-10-CM | POA: Diagnosis not present

## 2017-08-07 DIAGNOSIS — I2510 Atherosclerotic heart disease of native coronary artery without angina pectoris: Secondary | ICD-10-CM | POA: Diagnosis not present

## 2017-08-07 DIAGNOSIS — N183 Chronic kidney disease, stage 3 (moderate): Secondary | ICD-10-CM | POA: Diagnosis not present

## 2017-08-07 DIAGNOSIS — Z87440 Personal history of urinary (tract) infections: Secondary | ICD-10-CM | POA: Diagnosis not present

## 2017-08-07 DIAGNOSIS — F329 Major depressive disorder, single episode, unspecified: Secondary | ICD-10-CM | POA: Diagnosis not present

## 2017-08-07 DIAGNOSIS — M109 Gout, unspecified: Secondary | ICD-10-CM | POA: Diagnosis not present

## 2017-08-07 DIAGNOSIS — G301 Alzheimer's disease with late onset: Secondary | ICD-10-CM | POA: Diagnosis not present

## 2017-08-07 DIAGNOSIS — R1312 Dysphagia, oropharyngeal phase: Secondary | ICD-10-CM | POA: Diagnosis not present

## 2017-08-07 DIAGNOSIS — F0280 Dementia in other diseases classified elsewhere without behavioral disturbance: Secondary | ICD-10-CM | POA: Diagnosis not present

## 2017-08-08 DIAGNOSIS — G301 Alzheimer's disease with late onset: Secondary | ICD-10-CM | POA: Diagnosis not present

## 2017-08-08 DIAGNOSIS — E1122 Type 2 diabetes mellitus with diabetic chronic kidney disease: Secondary | ICD-10-CM | POA: Diagnosis not present

## 2017-08-08 DIAGNOSIS — I129 Hypertensive chronic kidney disease with stage 1 through stage 4 chronic kidney disease, or unspecified chronic kidney disease: Secondary | ICD-10-CM | POA: Diagnosis not present

## 2017-08-08 DIAGNOSIS — R1312 Dysphagia, oropharyngeal phase: Secondary | ICD-10-CM | POA: Diagnosis not present

## 2017-08-08 DIAGNOSIS — I2510 Atherosclerotic heart disease of native coronary artery without angina pectoris: Secondary | ICD-10-CM | POA: Diagnosis not present

## 2017-08-08 DIAGNOSIS — F0280 Dementia in other diseases classified elsewhere without behavioral disturbance: Secondary | ICD-10-CM | POA: Diagnosis not present

## 2017-08-12 DIAGNOSIS — I129 Hypertensive chronic kidney disease with stage 1 through stage 4 chronic kidney disease, or unspecified chronic kidney disease: Secondary | ICD-10-CM | POA: Diagnosis not present

## 2017-08-12 DIAGNOSIS — I2510 Atherosclerotic heart disease of native coronary artery without angina pectoris: Secondary | ICD-10-CM | POA: Diagnosis not present

## 2017-08-12 DIAGNOSIS — E1122 Type 2 diabetes mellitus with diabetic chronic kidney disease: Secondary | ICD-10-CM | POA: Diagnosis not present

## 2017-08-12 DIAGNOSIS — F0280 Dementia in other diseases classified elsewhere without behavioral disturbance: Secondary | ICD-10-CM | POA: Diagnosis not present

## 2017-08-12 DIAGNOSIS — R1312 Dysphagia, oropharyngeal phase: Secondary | ICD-10-CM | POA: Diagnosis not present

## 2017-08-12 DIAGNOSIS — G301 Alzheimer's disease with late onset: Secondary | ICD-10-CM | POA: Diagnosis not present

## 2017-08-13 DIAGNOSIS — R1312 Dysphagia, oropharyngeal phase: Secondary | ICD-10-CM | POA: Diagnosis not present

## 2017-08-13 DIAGNOSIS — F0280 Dementia in other diseases classified elsewhere without behavioral disturbance: Secondary | ICD-10-CM | POA: Diagnosis not present

## 2017-08-13 DIAGNOSIS — E1122 Type 2 diabetes mellitus with diabetic chronic kidney disease: Secondary | ICD-10-CM | POA: Diagnosis not present

## 2017-08-13 DIAGNOSIS — G301 Alzheimer's disease with late onset: Secondary | ICD-10-CM | POA: Diagnosis not present

## 2017-08-13 DIAGNOSIS — I2510 Atherosclerotic heart disease of native coronary artery without angina pectoris: Secondary | ICD-10-CM | POA: Diagnosis not present

## 2017-08-13 DIAGNOSIS — I129 Hypertensive chronic kidney disease with stage 1 through stage 4 chronic kidney disease, or unspecified chronic kidney disease: Secondary | ICD-10-CM | POA: Diagnosis not present

## 2017-08-14 DIAGNOSIS — I2510 Atherosclerotic heart disease of native coronary artery without angina pectoris: Secondary | ICD-10-CM | POA: Diagnosis not present

## 2017-08-14 DIAGNOSIS — I129 Hypertensive chronic kidney disease with stage 1 through stage 4 chronic kidney disease, or unspecified chronic kidney disease: Secondary | ICD-10-CM | POA: Diagnosis not present

## 2017-08-14 DIAGNOSIS — R1312 Dysphagia, oropharyngeal phase: Secondary | ICD-10-CM | POA: Diagnosis not present

## 2017-08-14 DIAGNOSIS — G301 Alzheimer's disease with late onset: Secondary | ICD-10-CM | POA: Diagnosis not present

## 2017-08-14 DIAGNOSIS — E1122 Type 2 diabetes mellitus with diabetic chronic kidney disease: Secondary | ICD-10-CM | POA: Diagnosis not present

## 2017-08-14 DIAGNOSIS — F0280 Dementia in other diseases classified elsewhere without behavioral disturbance: Secondary | ICD-10-CM | POA: Diagnosis not present

## 2017-08-16 DIAGNOSIS — G301 Alzheimer's disease with late onset: Secondary | ICD-10-CM | POA: Diagnosis not present

## 2017-08-16 DIAGNOSIS — I2510 Atherosclerotic heart disease of native coronary artery without angina pectoris: Secondary | ICD-10-CM | POA: Diagnosis not present

## 2017-08-16 DIAGNOSIS — R1312 Dysphagia, oropharyngeal phase: Secondary | ICD-10-CM | POA: Diagnosis not present

## 2017-08-16 DIAGNOSIS — I129 Hypertensive chronic kidney disease with stage 1 through stage 4 chronic kidney disease, or unspecified chronic kidney disease: Secondary | ICD-10-CM | POA: Diagnosis not present

## 2017-08-16 DIAGNOSIS — E1122 Type 2 diabetes mellitus with diabetic chronic kidney disease: Secondary | ICD-10-CM | POA: Diagnosis not present

## 2017-08-16 DIAGNOSIS — F0280 Dementia in other diseases classified elsewhere without behavioral disturbance: Secondary | ICD-10-CM | POA: Diagnosis not present

## 2017-08-19 DIAGNOSIS — G301 Alzheimer's disease with late onset: Secondary | ICD-10-CM | POA: Diagnosis not present

## 2017-08-19 DIAGNOSIS — I2510 Atherosclerotic heart disease of native coronary artery without angina pectoris: Secondary | ICD-10-CM | POA: Diagnosis not present

## 2017-08-19 DIAGNOSIS — R1312 Dysphagia, oropharyngeal phase: Secondary | ICD-10-CM | POA: Diagnosis not present

## 2017-08-19 DIAGNOSIS — E1122 Type 2 diabetes mellitus with diabetic chronic kidney disease: Secondary | ICD-10-CM | POA: Diagnosis not present

## 2017-08-19 DIAGNOSIS — F0280 Dementia in other diseases classified elsewhere without behavioral disturbance: Secondary | ICD-10-CM | POA: Diagnosis not present

## 2017-08-19 DIAGNOSIS — I129 Hypertensive chronic kidney disease with stage 1 through stage 4 chronic kidney disease, or unspecified chronic kidney disease: Secondary | ICD-10-CM | POA: Diagnosis not present

## 2017-08-20 DIAGNOSIS — B351 Tinea unguium: Secondary | ICD-10-CM | POA: Diagnosis not present

## 2017-08-20 DIAGNOSIS — E1122 Type 2 diabetes mellitus with diabetic chronic kidney disease: Secondary | ICD-10-CM | POA: Diagnosis not present

## 2017-08-20 DIAGNOSIS — R1312 Dysphagia, oropharyngeal phase: Secondary | ICD-10-CM | POA: Diagnosis not present

## 2017-08-20 DIAGNOSIS — G301 Alzheimer's disease with late onset: Secondary | ICD-10-CM | POA: Diagnosis not present

## 2017-08-20 DIAGNOSIS — I739 Peripheral vascular disease, unspecified: Secondary | ICD-10-CM | POA: Diagnosis not present

## 2017-08-20 DIAGNOSIS — R21 Rash and other nonspecific skin eruption: Secondary | ICD-10-CM | POA: Diagnosis not present

## 2017-08-20 DIAGNOSIS — F0390 Unspecified dementia without behavioral disturbance: Secondary | ICD-10-CM | POA: Diagnosis not present

## 2017-08-20 DIAGNOSIS — F0280 Dementia in other diseases classified elsewhere without behavioral disturbance: Secondary | ICD-10-CM | POA: Diagnosis not present

## 2017-08-20 DIAGNOSIS — I2510 Atherosclerotic heart disease of native coronary artery without angina pectoris: Secondary | ICD-10-CM | POA: Diagnosis not present

## 2017-08-20 DIAGNOSIS — I129 Hypertensive chronic kidney disease with stage 1 through stage 4 chronic kidney disease, or unspecified chronic kidney disease: Secondary | ICD-10-CM | POA: Diagnosis not present

## 2017-08-20 DIAGNOSIS — Z79899 Other long term (current) drug therapy: Secondary | ICD-10-CM | POA: Diagnosis not present

## 2017-08-20 DIAGNOSIS — L299 Pruritus, unspecified: Secondary | ICD-10-CM | POA: Diagnosis not present

## 2017-08-21 DIAGNOSIS — I2510 Atherosclerotic heart disease of native coronary artery without angina pectoris: Secondary | ICD-10-CM | POA: Diagnosis not present

## 2017-08-21 DIAGNOSIS — R1312 Dysphagia, oropharyngeal phase: Secondary | ICD-10-CM | POA: Diagnosis not present

## 2017-08-21 DIAGNOSIS — G301 Alzheimer's disease with late onset: Secondary | ICD-10-CM | POA: Diagnosis not present

## 2017-08-21 DIAGNOSIS — F0280 Dementia in other diseases classified elsewhere without behavioral disturbance: Secondary | ICD-10-CM | POA: Diagnosis not present

## 2017-08-21 DIAGNOSIS — E1122 Type 2 diabetes mellitus with diabetic chronic kidney disease: Secondary | ICD-10-CM | POA: Diagnosis not present

## 2017-08-21 DIAGNOSIS — I129 Hypertensive chronic kidney disease with stage 1 through stage 4 chronic kidney disease, or unspecified chronic kidney disease: Secondary | ICD-10-CM | POA: Diagnosis not present

## 2017-08-22 DIAGNOSIS — I129 Hypertensive chronic kidney disease with stage 1 through stage 4 chronic kidney disease, or unspecified chronic kidney disease: Secondary | ICD-10-CM | POA: Diagnosis not present

## 2017-08-22 DIAGNOSIS — R1312 Dysphagia, oropharyngeal phase: Secondary | ICD-10-CM | POA: Diagnosis not present

## 2017-08-22 DIAGNOSIS — G301 Alzheimer's disease with late onset: Secondary | ICD-10-CM | POA: Diagnosis not present

## 2017-08-22 DIAGNOSIS — F0280 Dementia in other diseases classified elsewhere without behavioral disturbance: Secondary | ICD-10-CM | POA: Diagnosis not present

## 2017-08-22 DIAGNOSIS — I2510 Atherosclerotic heart disease of native coronary artery without angina pectoris: Secondary | ICD-10-CM | POA: Diagnosis not present

## 2017-08-22 DIAGNOSIS — E1122 Type 2 diabetes mellitus with diabetic chronic kidney disease: Secondary | ICD-10-CM | POA: Diagnosis not present

## 2017-08-26 ENCOUNTER — Other Ambulatory Visit: Payer: Self-pay

## 2017-08-26 DIAGNOSIS — I129 Hypertensive chronic kidney disease with stage 1 through stage 4 chronic kidney disease, or unspecified chronic kidney disease: Secondary | ICD-10-CM | POA: Diagnosis not present

## 2017-08-26 DIAGNOSIS — R1312 Dysphagia, oropharyngeal phase: Secondary | ICD-10-CM | POA: Diagnosis not present

## 2017-08-26 DIAGNOSIS — I2510 Atherosclerotic heart disease of native coronary artery without angina pectoris: Secondary | ICD-10-CM | POA: Diagnosis not present

## 2017-08-26 DIAGNOSIS — G301 Alzheimer's disease with late onset: Secondary | ICD-10-CM | POA: Diagnosis not present

## 2017-08-26 DIAGNOSIS — F0280 Dementia in other diseases classified elsewhere without behavioral disturbance: Secondary | ICD-10-CM | POA: Diagnosis not present

## 2017-08-26 DIAGNOSIS — E1122 Type 2 diabetes mellitus with diabetic chronic kidney disease: Secondary | ICD-10-CM | POA: Diagnosis not present

## 2017-08-27 DIAGNOSIS — I2510 Atherosclerotic heart disease of native coronary artery without angina pectoris: Secondary | ICD-10-CM | POA: Diagnosis not present

## 2017-08-27 DIAGNOSIS — G301 Alzheimer's disease with late onset: Secondary | ICD-10-CM | POA: Diagnosis not present

## 2017-08-27 DIAGNOSIS — E1122 Type 2 diabetes mellitus with diabetic chronic kidney disease: Secondary | ICD-10-CM | POA: Diagnosis not present

## 2017-08-27 DIAGNOSIS — F0280 Dementia in other diseases classified elsewhere without behavioral disturbance: Secondary | ICD-10-CM | POA: Diagnosis not present

## 2017-08-27 DIAGNOSIS — R1312 Dysphagia, oropharyngeal phase: Secondary | ICD-10-CM | POA: Diagnosis not present

## 2017-08-27 DIAGNOSIS — I129 Hypertensive chronic kidney disease with stage 1 through stage 4 chronic kidney disease, or unspecified chronic kidney disease: Secondary | ICD-10-CM | POA: Diagnosis not present

## 2017-08-28 DIAGNOSIS — I129 Hypertensive chronic kidney disease with stage 1 through stage 4 chronic kidney disease, or unspecified chronic kidney disease: Secondary | ICD-10-CM | POA: Diagnosis not present

## 2017-08-28 DIAGNOSIS — G301 Alzheimer's disease with late onset: Secondary | ICD-10-CM | POA: Diagnosis not present

## 2017-08-28 DIAGNOSIS — F0280 Dementia in other diseases classified elsewhere without behavioral disturbance: Secondary | ICD-10-CM | POA: Diagnosis not present

## 2017-08-28 DIAGNOSIS — I2510 Atherosclerotic heart disease of native coronary artery without angina pectoris: Secondary | ICD-10-CM | POA: Diagnosis not present

## 2017-08-28 DIAGNOSIS — E1122 Type 2 diabetes mellitus with diabetic chronic kidney disease: Secondary | ICD-10-CM | POA: Diagnosis not present

## 2017-08-28 DIAGNOSIS — R1312 Dysphagia, oropharyngeal phase: Secondary | ICD-10-CM | POA: Diagnosis not present

## 2017-08-29 DIAGNOSIS — G301 Alzheimer's disease with late onset: Secondary | ICD-10-CM | POA: Diagnosis not present

## 2017-08-29 DIAGNOSIS — R1312 Dysphagia, oropharyngeal phase: Secondary | ICD-10-CM | POA: Diagnosis not present

## 2017-08-29 DIAGNOSIS — E1122 Type 2 diabetes mellitus with diabetic chronic kidney disease: Secondary | ICD-10-CM | POA: Diagnosis not present

## 2017-08-29 DIAGNOSIS — I2510 Atherosclerotic heart disease of native coronary artery without angina pectoris: Secondary | ICD-10-CM | POA: Diagnosis not present

## 2017-08-29 DIAGNOSIS — F0280 Dementia in other diseases classified elsewhere without behavioral disturbance: Secondary | ICD-10-CM | POA: Diagnosis not present

## 2017-08-29 DIAGNOSIS — I129 Hypertensive chronic kidney disease with stage 1 through stage 4 chronic kidney disease, or unspecified chronic kidney disease: Secondary | ICD-10-CM | POA: Diagnosis not present

## 2017-09-02 DIAGNOSIS — R1312 Dysphagia, oropharyngeal phase: Secondary | ICD-10-CM | POA: Diagnosis not present

## 2017-09-02 DIAGNOSIS — F0280 Dementia in other diseases classified elsewhere without behavioral disturbance: Secondary | ICD-10-CM | POA: Diagnosis not present

## 2017-09-02 DIAGNOSIS — I2510 Atherosclerotic heart disease of native coronary artery without angina pectoris: Secondary | ICD-10-CM | POA: Diagnosis not present

## 2017-09-02 DIAGNOSIS — E1122 Type 2 diabetes mellitus with diabetic chronic kidney disease: Secondary | ICD-10-CM | POA: Diagnosis not present

## 2017-09-02 DIAGNOSIS — I129 Hypertensive chronic kidney disease with stage 1 through stage 4 chronic kidney disease, or unspecified chronic kidney disease: Secondary | ICD-10-CM | POA: Diagnosis not present

## 2017-09-02 DIAGNOSIS — G301 Alzheimer's disease with late onset: Secondary | ICD-10-CM | POA: Diagnosis not present

## 2017-09-03 DIAGNOSIS — F0390 Unspecified dementia without behavioral disturbance: Secondary | ICD-10-CM | POA: Diagnosis not present

## 2017-09-03 DIAGNOSIS — I129 Hypertensive chronic kidney disease with stage 1 through stage 4 chronic kidney disease, or unspecified chronic kidney disease: Secondary | ICD-10-CM | POA: Diagnosis not present

## 2017-09-03 DIAGNOSIS — Z79899 Other long term (current) drug therapy: Secondary | ICD-10-CM | POA: Diagnosis not present

## 2017-09-03 DIAGNOSIS — R21 Rash and other nonspecific skin eruption: Secondary | ICD-10-CM | POA: Diagnosis not present

## 2017-09-03 DIAGNOSIS — G301 Alzheimer's disease with late onset: Secondary | ICD-10-CM | POA: Diagnosis not present

## 2017-09-03 DIAGNOSIS — R1312 Dysphagia, oropharyngeal phase: Secondary | ICD-10-CM | POA: Diagnosis not present

## 2017-09-03 DIAGNOSIS — I2510 Atherosclerotic heart disease of native coronary artery without angina pectoris: Secondary | ICD-10-CM | POA: Diagnosis not present

## 2017-09-03 DIAGNOSIS — F0280 Dementia in other diseases classified elsewhere without behavioral disturbance: Secondary | ICD-10-CM | POA: Diagnosis not present

## 2017-09-03 DIAGNOSIS — R451 Restlessness and agitation: Secondary | ICD-10-CM | POA: Diagnosis not present

## 2017-09-03 DIAGNOSIS — E1122 Type 2 diabetes mellitus with diabetic chronic kidney disease: Secondary | ICD-10-CM | POA: Diagnosis not present

## 2017-09-04 DIAGNOSIS — I2510 Atherosclerotic heart disease of native coronary artery without angina pectoris: Secondary | ICD-10-CM | POA: Diagnosis not present

## 2017-09-04 DIAGNOSIS — F0280 Dementia in other diseases classified elsewhere without behavioral disturbance: Secondary | ICD-10-CM | POA: Diagnosis not present

## 2017-09-04 DIAGNOSIS — E1122 Type 2 diabetes mellitus with diabetic chronic kidney disease: Secondary | ICD-10-CM | POA: Diagnosis not present

## 2017-09-04 DIAGNOSIS — G301 Alzheimer's disease with late onset: Secondary | ICD-10-CM | POA: Diagnosis not present

## 2017-09-04 DIAGNOSIS — R1312 Dysphagia, oropharyngeal phase: Secondary | ICD-10-CM | POA: Diagnosis not present

## 2017-09-04 DIAGNOSIS — I129 Hypertensive chronic kidney disease with stage 1 through stage 4 chronic kidney disease, or unspecified chronic kidney disease: Secondary | ICD-10-CM | POA: Diagnosis not present

## 2017-09-05 DIAGNOSIS — F0280 Dementia in other diseases classified elsewhere without behavioral disturbance: Secondary | ICD-10-CM | POA: Diagnosis not present

## 2017-09-05 DIAGNOSIS — E1122 Type 2 diabetes mellitus with diabetic chronic kidney disease: Secondary | ICD-10-CM | POA: Diagnosis not present

## 2017-09-05 DIAGNOSIS — I129 Hypertensive chronic kidney disease with stage 1 through stage 4 chronic kidney disease, or unspecified chronic kidney disease: Secondary | ICD-10-CM | POA: Diagnosis not present

## 2017-09-05 DIAGNOSIS — G301 Alzheimer's disease with late onset: Secondary | ICD-10-CM | POA: Diagnosis not present

## 2017-09-05 DIAGNOSIS — R1312 Dysphagia, oropharyngeal phase: Secondary | ICD-10-CM | POA: Diagnosis not present

## 2017-09-05 DIAGNOSIS — I2510 Atherosclerotic heart disease of native coronary artery without angina pectoris: Secondary | ICD-10-CM | POA: Diagnosis not present

## 2017-09-09 DIAGNOSIS — I2510 Atherosclerotic heart disease of native coronary artery without angina pectoris: Secondary | ICD-10-CM | POA: Diagnosis not present

## 2017-09-09 DIAGNOSIS — F0280 Dementia in other diseases classified elsewhere without behavioral disturbance: Secondary | ICD-10-CM | POA: Diagnosis not present

## 2017-09-09 DIAGNOSIS — E1122 Type 2 diabetes mellitus with diabetic chronic kidney disease: Secondary | ICD-10-CM | POA: Diagnosis not present

## 2017-09-09 DIAGNOSIS — I129 Hypertensive chronic kidney disease with stage 1 through stage 4 chronic kidney disease, or unspecified chronic kidney disease: Secondary | ICD-10-CM | POA: Diagnosis not present

## 2017-09-09 DIAGNOSIS — G301 Alzheimer's disease with late onset: Secondary | ICD-10-CM | POA: Diagnosis not present

## 2017-09-09 DIAGNOSIS — R1312 Dysphagia, oropharyngeal phase: Secondary | ICD-10-CM | POA: Diagnosis not present

## 2017-09-10 DIAGNOSIS — I679 Cerebrovascular disease, unspecified: Secondary | ICD-10-CM | POA: Diagnosis not present

## 2017-09-10 DIAGNOSIS — Z79899 Other long term (current) drug therapy: Secondary | ICD-10-CM | POA: Diagnosis not present

## 2017-09-10 DIAGNOSIS — F0390 Unspecified dementia without behavioral disturbance: Secondary | ICD-10-CM | POA: Diagnosis not present

## 2017-09-12 DIAGNOSIS — I2510 Atherosclerotic heart disease of native coronary artery without angina pectoris: Secondary | ICD-10-CM | POA: Diagnosis not present

## 2017-09-12 DIAGNOSIS — G301 Alzheimer's disease with late onset: Secondary | ICD-10-CM | POA: Diagnosis not present

## 2017-09-12 DIAGNOSIS — F0280 Dementia in other diseases classified elsewhere without behavioral disturbance: Secondary | ICD-10-CM | POA: Diagnosis not present

## 2017-09-12 DIAGNOSIS — I129 Hypertensive chronic kidney disease with stage 1 through stage 4 chronic kidney disease, or unspecified chronic kidney disease: Secondary | ICD-10-CM | POA: Diagnosis not present

## 2017-09-12 DIAGNOSIS — E1122 Type 2 diabetes mellitus with diabetic chronic kidney disease: Secondary | ICD-10-CM | POA: Diagnosis not present

## 2017-09-12 DIAGNOSIS — R1312 Dysphagia, oropharyngeal phase: Secondary | ICD-10-CM | POA: Diagnosis not present

## 2017-09-16 DIAGNOSIS — E1122 Type 2 diabetes mellitus with diabetic chronic kidney disease: Secondary | ICD-10-CM | POA: Diagnosis not present

## 2017-09-16 DIAGNOSIS — F0280 Dementia in other diseases classified elsewhere without behavioral disturbance: Secondary | ICD-10-CM | POA: Diagnosis not present

## 2017-09-16 DIAGNOSIS — R1312 Dysphagia, oropharyngeal phase: Secondary | ICD-10-CM | POA: Diagnosis not present

## 2017-09-16 DIAGNOSIS — G301 Alzheimer's disease with late onset: Secondary | ICD-10-CM | POA: Diagnosis not present

## 2017-09-16 DIAGNOSIS — I129 Hypertensive chronic kidney disease with stage 1 through stage 4 chronic kidney disease, or unspecified chronic kidney disease: Secondary | ICD-10-CM | POA: Diagnosis not present

## 2017-09-16 DIAGNOSIS — I2510 Atherosclerotic heart disease of native coronary artery without angina pectoris: Secondary | ICD-10-CM | POA: Diagnosis not present

## 2017-09-17 DIAGNOSIS — E1122 Type 2 diabetes mellitus with diabetic chronic kidney disease: Secondary | ICD-10-CM | POA: Diagnosis not present

## 2017-09-17 DIAGNOSIS — I129 Hypertensive chronic kidney disease with stage 1 through stage 4 chronic kidney disease, or unspecified chronic kidney disease: Secondary | ICD-10-CM | POA: Diagnosis not present

## 2017-09-17 DIAGNOSIS — G301 Alzheimer's disease with late onset: Secondary | ICD-10-CM | POA: Diagnosis not present

## 2017-09-17 DIAGNOSIS — Z79899 Other long term (current) drug therapy: Secondary | ICD-10-CM | POA: Diagnosis not present

## 2017-09-17 DIAGNOSIS — R451 Restlessness and agitation: Secondary | ICD-10-CM | POA: Diagnosis not present

## 2017-09-17 DIAGNOSIS — R1312 Dysphagia, oropharyngeal phase: Secondary | ICD-10-CM | POA: Diagnosis not present

## 2017-09-17 DIAGNOSIS — S91301D Unspecified open wound, right foot, subsequent encounter: Secondary | ICD-10-CM | POA: Diagnosis not present

## 2017-09-17 DIAGNOSIS — F0280 Dementia in other diseases classified elsewhere without behavioral disturbance: Secondary | ICD-10-CM | POA: Diagnosis not present

## 2017-09-17 DIAGNOSIS — I2510 Atherosclerotic heart disease of native coronary artery without angina pectoris: Secondary | ICD-10-CM | POA: Diagnosis not present

## 2017-09-17 DIAGNOSIS — F39 Unspecified mood [affective] disorder: Secondary | ICD-10-CM | POA: Diagnosis not present

## 2017-09-18 DIAGNOSIS — N390 Urinary tract infection, site not specified: Secondary | ICD-10-CM | POA: Diagnosis not present

## 2017-09-19 DIAGNOSIS — I129 Hypertensive chronic kidney disease with stage 1 through stage 4 chronic kidney disease, or unspecified chronic kidney disease: Secondary | ICD-10-CM | POA: Diagnosis not present

## 2017-09-19 DIAGNOSIS — I2510 Atherosclerotic heart disease of native coronary artery without angina pectoris: Secondary | ICD-10-CM | POA: Diagnosis not present

## 2017-09-19 DIAGNOSIS — F0150 Vascular dementia without behavioral disturbance: Secondary | ICD-10-CM | POA: Diagnosis not present

## 2017-09-19 DIAGNOSIS — E1122 Type 2 diabetes mellitus with diabetic chronic kidney disease: Secondary | ICD-10-CM | POA: Diagnosis not present

## 2017-09-19 DIAGNOSIS — R1312 Dysphagia, oropharyngeal phase: Secondary | ICD-10-CM | POA: Diagnosis not present

## 2017-09-19 DIAGNOSIS — G301 Alzheimer's disease with late onset: Secondary | ICD-10-CM | POA: Diagnosis not present

## 2017-09-19 DIAGNOSIS — F0280 Dementia in other diseases classified elsewhere without behavioral disturbance: Secondary | ICD-10-CM | POA: Diagnosis not present

## 2017-09-20 DIAGNOSIS — L989 Disorder of the skin and subcutaneous tissue, unspecified: Secondary | ICD-10-CM | POA: Diagnosis not present

## 2017-09-20 DIAGNOSIS — F0390 Unspecified dementia without behavioral disturbance: Secondary | ICD-10-CM | POA: Diagnosis not present

## 2017-09-20 DIAGNOSIS — I10 Essential (primary) hypertension: Secondary | ICD-10-CM | POA: Diagnosis not present

## 2017-09-20 DIAGNOSIS — E119 Type 2 diabetes mellitus without complications: Secondary | ICD-10-CM | POA: Diagnosis not present

## 2017-09-24 DIAGNOSIS — G301 Alzheimer's disease with late onset: Secondary | ICD-10-CM | POA: Diagnosis not present

## 2017-09-24 DIAGNOSIS — Z79899 Other long term (current) drug therapy: Secondary | ICD-10-CM | POA: Diagnosis not present

## 2017-09-24 DIAGNOSIS — I129 Hypertensive chronic kidney disease with stage 1 through stage 4 chronic kidney disease, or unspecified chronic kidney disease: Secondary | ICD-10-CM | POA: Diagnosis not present

## 2017-09-24 DIAGNOSIS — E1122 Type 2 diabetes mellitus with diabetic chronic kidney disease: Secondary | ICD-10-CM | POA: Diagnosis not present

## 2017-09-24 DIAGNOSIS — R1312 Dysphagia, oropharyngeal phase: Secondary | ICD-10-CM | POA: Diagnosis not present

## 2017-09-24 DIAGNOSIS — I2510 Atherosclerotic heart disease of native coronary artery without angina pectoris: Secondary | ICD-10-CM | POA: Diagnosis not present

## 2017-09-24 DIAGNOSIS — N3000 Acute cystitis without hematuria: Secondary | ICD-10-CM | POA: Diagnosis not present

## 2017-09-24 DIAGNOSIS — R451 Restlessness and agitation: Secondary | ICD-10-CM | POA: Diagnosis not present

## 2017-09-24 DIAGNOSIS — L989 Disorder of the skin and subcutaneous tissue, unspecified: Secondary | ICD-10-CM | POA: Diagnosis not present

## 2017-09-24 DIAGNOSIS — F0151 Vascular dementia with behavioral disturbance: Secondary | ICD-10-CM | POA: Diagnosis not present

## 2017-09-24 DIAGNOSIS — F0280 Dementia in other diseases classified elsewhere without behavioral disturbance: Secondary | ICD-10-CM | POA: Diagnosis not present

## 2017-09-26 DIAGNOSIS — I129 Hypertensive chronic kidney disease with stage 1 through stage 4 chronic kidney disease, or unspecified chronic kidney disease: Secondary | ICD-10-CM | POA: Diagnosis not present

## 2017-09-26 DIAGNOSIS — F0280 Dementia in other diseases classified elsewhere without behavioral disturbance: Secondary | ICD-10-CM | POA: Diagnosis not present

## 2017-09-26 DIAGNOSIS — I2510 Atherosclerotic heart disease of native coronary artery without angina pectoris: Secondary | ICD-10-CM | POA: Diagnosis not present

## 2017-09-26 DIAGNOSIS — R1312 Dysphagia, oropharyngeal phase: Secondary | ICD-10-CM | POA: Diagnosis not present

## 2017-09-26 DIAGNOSIS — G301 Alzheimer's disease with late onset: Secondary | ICD-10-CM | POA: Diagnosis not present

## 2017-09-26 DIAGNOSIS — E1122 Type 2 diabetes mellitus with diabetic chronic kidney disease: Secondary | ICD-10-CM | POA: Diagnosis not present

## 2017-09-26 DIAGNOSIS — N390 Urinary tract infection, site not specified: Secondary | ICD-10-CM | POA: Diagnosis not present

## 2017-09-27 DIAGNOSIS — Z79899 Other long term (current) drug therapy: Secondary | ICD-10-CM | POA: Diagnosis not present

## 2017-09-30 DIAGNOSIS — F0280 Dementia in other diseases classified elsewhere without behavioral disturbance: Secondary | ICD-10-CM | POA: Diagnosis not present

## 2017-09-30 DIAGNOSIS — I129 Hypertensive chronic kidney disease with stage 1 through stage 4 chronic kidney disease, or unspecified chronic kidney disease: Secondary | ICD-10-CM | POA: Diagnosis not present

## 2017-09-30 DIAGNOSIS — E1122 Type 2 diabetes mellitus with diabetic chronic kidney disease: Secondary | ICD-10-CM | POA: Diagnosis not present

## 2017-09-30 DIAGNOSIS — G301 Alzheimer's disease with late onset: Secondary | ICD-10-CM | POA: Diagnosis not present

## 2017-09-30 DIAGNOSIS — I2510 Atherosclerotic heart disease of native coronary artery without angina pectoris: Secondary | ICD-10-CM | POA: Diagnosis not present

## 2017-09-30 DIAGNOSIS — R1312 Dysphagia, oropharyngeal phase: Secondary | ICD-10-CM | POA: Diagnosis not present

## 2017-10-01 DIAGNOSIS — F0151 Vascular dementia with behavioral disturbance: Secondary | ICD-10-CM | POA: Diagnosis not present

## 2017-10-01 DIAGNOSIS — Z79899 Other long term (current) drug therapy: Secondary | ICD-10-CM | POA: Diagnosis not present

## 2017-10-01 DIAGNOSIS — E870 Hyperosmolality and hypernatremia: Secondary | ICD-10-CM | POA: Diagnosis not present

## 2017-10-01 DIAGNOSIS — L989 Disorder of the skin and subcutaneous tissue, unspecified: Secondary | ICD-10-CM | POA: Diagnosis not present

## 2017-10-01 DIAGNOSIS — I10 Essential (primary) hypertension: Secondary | ICD-10-CM | POA: Diagnosis not present

## 2017-10-03 DIAGNOSIS — F0280 Dementia in other diseases classified elsewhere without behavioral disturbance: Secondary | ICD-10-CM | POA: Diagnosis not present

## 2017-10-03 DIAGNOSIS — R1312 Dysphagia, oropharyngeal phase: Secondary | ICD-10-CM | POA: Diagnosis not present

## 2017-10-03 DIAGNOSIS — I2510 Atherosclerotic heart disease of native coronary artery without angina pectoris: Secondary | ICD-10-CM | POA: Diagnosis not present

## 2017-10-03 DIAGNOSIS — E1122 Type 2 diabetes mellitus with diabetic chronic kidney disease: Secondary | ICD-10-CM | POA: Diagnosis not present

## 2017-10-03 DIAGNOSIS — I129 Hypertensive chronic kidney disease with stage 1 through stage 4 chronic kidney disease, or unspecified chronic kidney disease: Secondary | ICD-10-CM | POA: Diagnosis not present

## 2017-10-03 DIAGNOSIS — G301 Alzheimer's disease with late onset: Secondary | ICD-10-CM | POA: Diagnosis not present

## 2017-10-06 DIAGNOSIS — F0280 Dementia in other diseases classified elsewhere without behavioral disturbance: Secondary | ICD-10-CM | POA: Diagnosis not present

## 2017-10-06 DIAGNOSIS — G301 Alzheimer's disease with late onset: Secondary | ICD-10-CM | POA: Diagnosis not present

## 2017-10-06 DIAGNOSIS — L989 Disorder of the skin and subcutaneous tissue, unspecified: Secondary | ICD-10-CM | POA: Diagnosis not present

## 2017-10-28 DIAGNOSIS — L0100 Impetigo, unspecified: Secondary | ICD-10-CM | POA: Diagnosis not present

## 2017-10-28 DIAGNOSIS — I8310 Varicose veins of unspecified lower extremity with inflammation: Secondary | ICD-10-CM | POA: Diagnosis not present

## 2017-10-30 IMAGING — CR DG CHEST 2V
2 series · 2 of 2 positions shown · non-contrast
Comparison: 10/06/2009 chest radiograph.

CLINICAL DATA: Shortness of breath for 5 days

EXAM:
CHEST  2 VIEW

[chest pa]
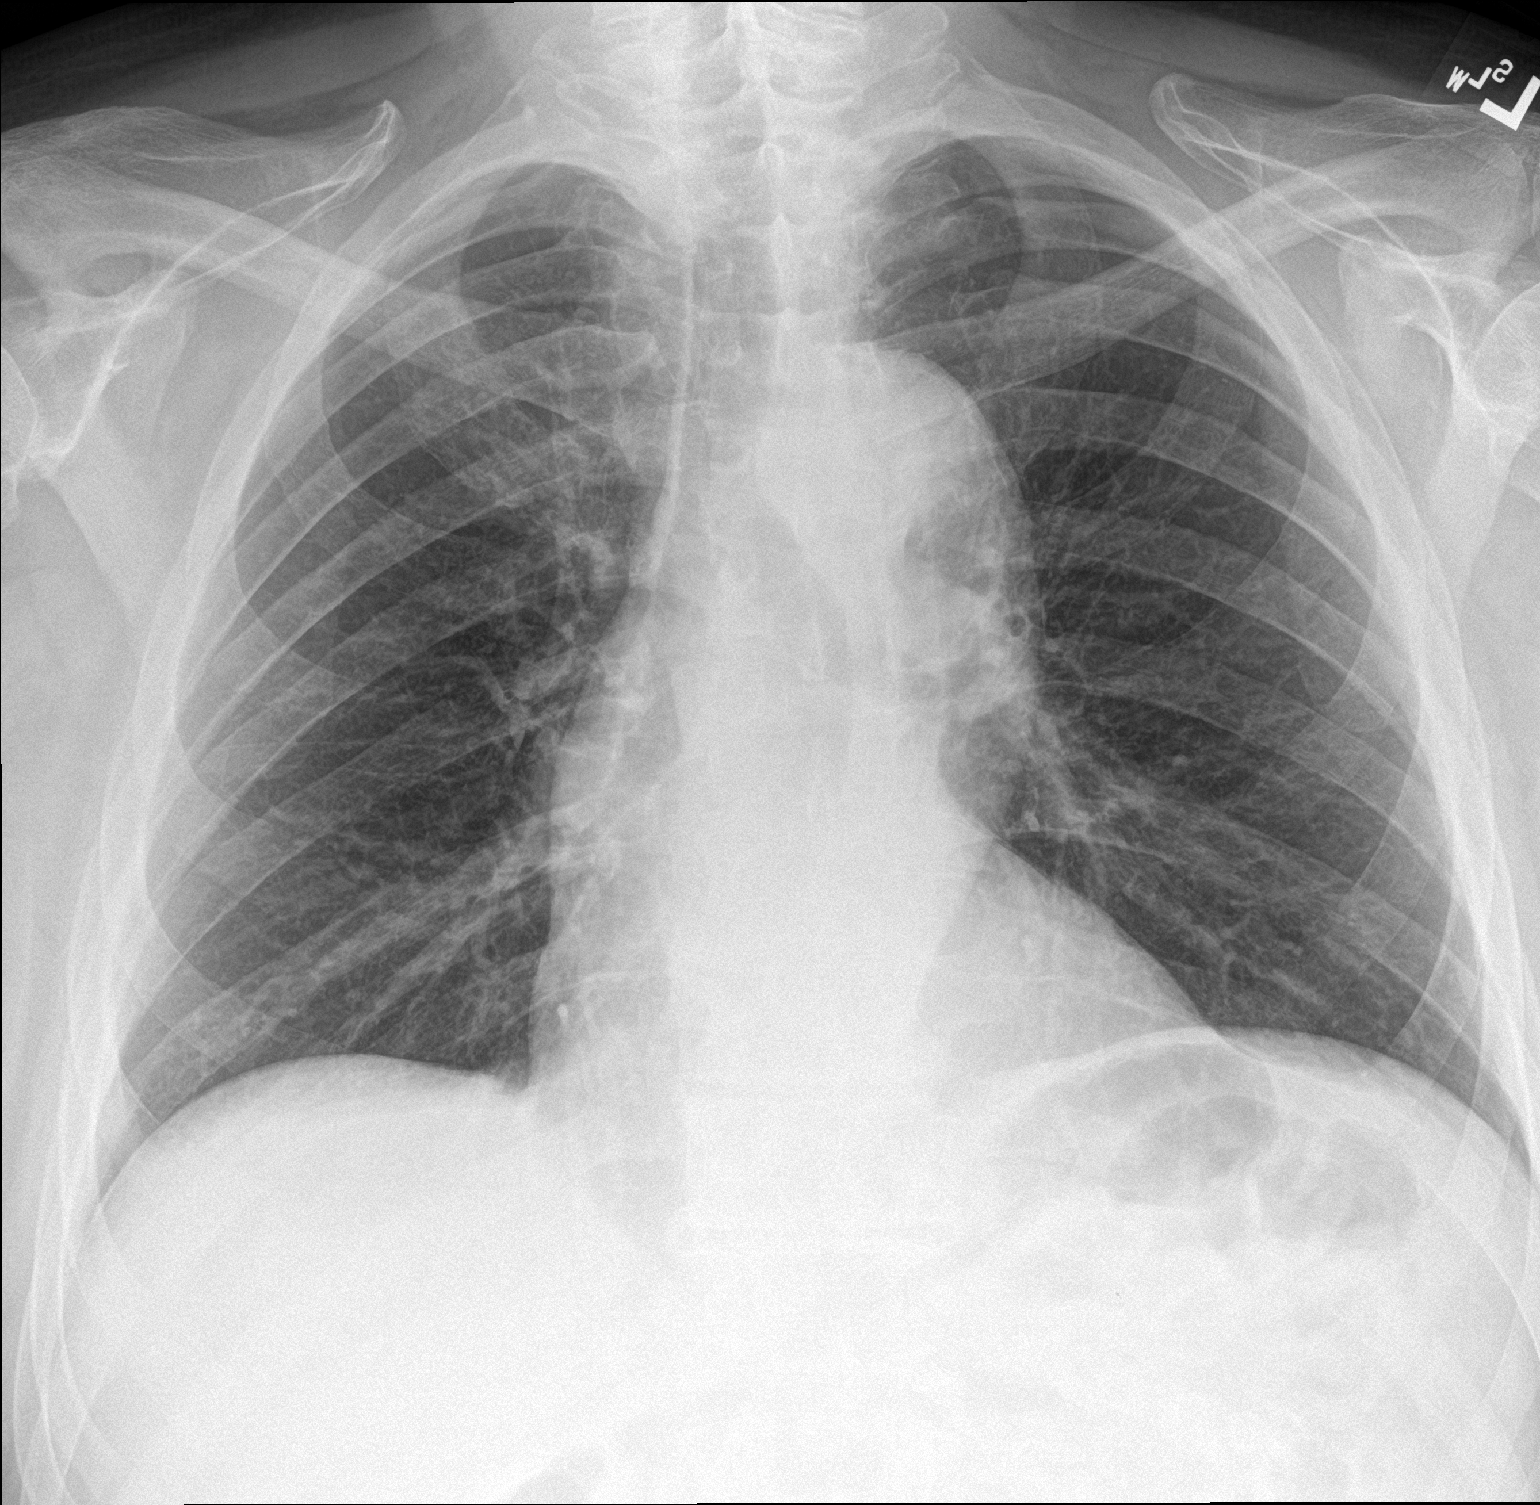

[chest lat]
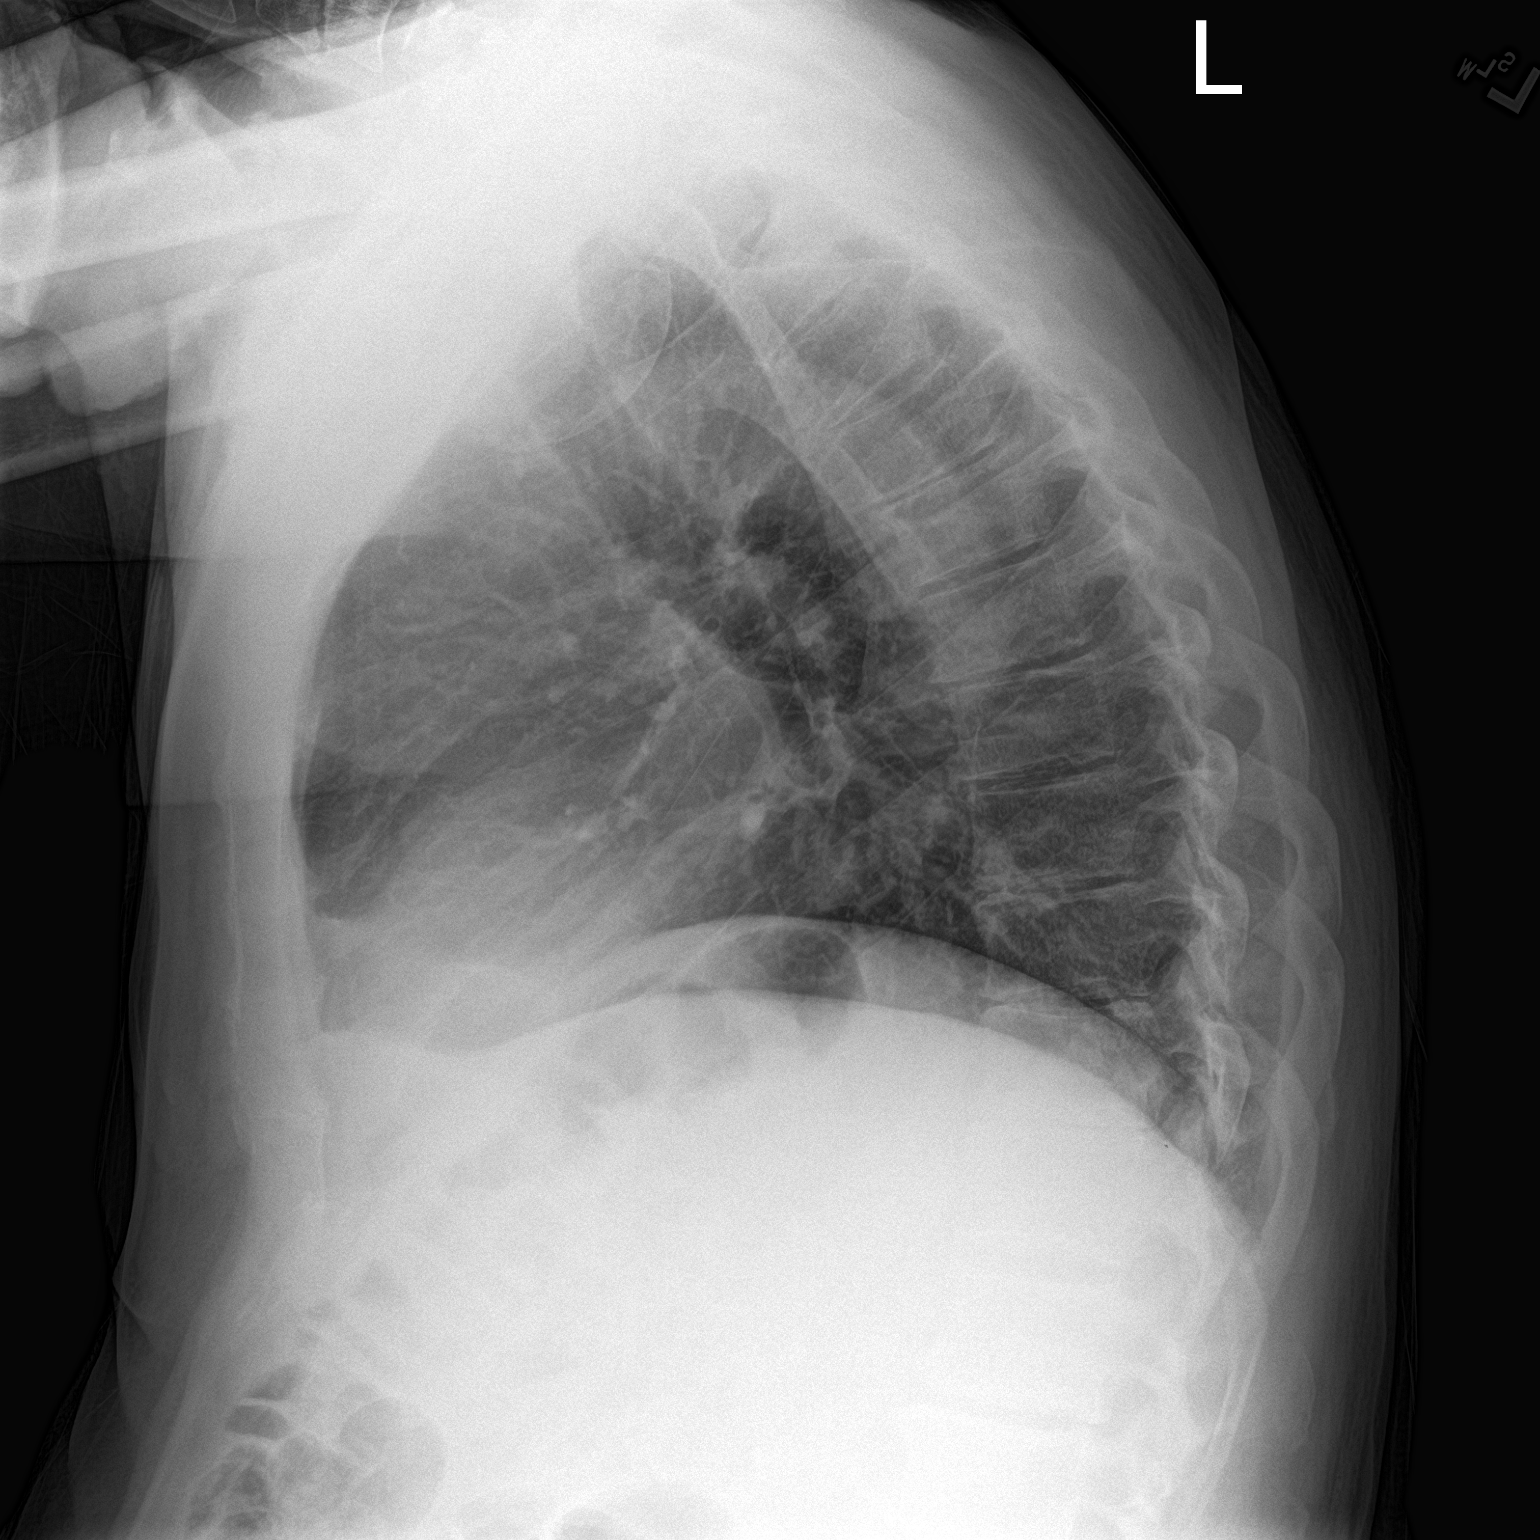

[2 of 2 positions shown; findings below may reference images not displayed]

FINDINGS: Normal heart size. Moderately tortuous atherosclerotic thoracic
aorta, with increased tortuosity in the interval. Otherwise stable
and normal mediastinal contour. No pneumothorax. No pleural
effusion. Clear lungs, with no focal lung consolidation and no
pulmonary edema.
IMPRESSION: No active cardiopulmonary disease.

## 2017-10-31 DIAGNOSIS — F0150 Vascular dementia without behavioral disturbance: Secondary | ICD-10-CM | POA: Diagnosis not present

## 2017-11-05 DIAGNOSIS — Z79899 Other long term (current) drug therapy: Secondary | ICD-10-CM | POA: Diagnosis not present

## 2017-11-05 DIAGNOSIS — T148XXA Other injury of unspecified body region, initial encounter: Secondary | ICD-10-CM | POA: Diagnosis not present

## 2017-11-12 DIAGNOSIS — R1319 Other dysphagia: Secondary | ICD-10-CM | POA: Diagnosis not present

## 2017-11-18 DIAGNOSIS — L0100 Impetigo, unspecified: Secondary | ICD-10-CM | POA: Diagnosis not present

## 2017-11-18 DIAGNOSIS — L810 Postinflammatory hyperpigmentation: Secondary | ICD-10-CM | POA: Diagnosis not present

## 2017-11-19 DIAGNOSIS — S70222A Blister (nonthermal), left hip, initial encounter: Secondary | ICD-10-CM | POA: Diagnosis not present

## 2017-11-19 DIAGNOSIS — Z993 Dependence on wheelchair: Secondary | ICD-10-CM | POA: Diagnosis not present

## 2017-11-19 DIAGNOSIS — F0150 Vascular dementia without behavioral disturbance: Secondary | ICD-10-CM | POA: Diagnosis not present

## 2017-11-19 DIAGNOSIS — Z79899 Other long term (current) drug therapy: Secondary | ICD-10-CM | POA: Diagnosis not present

## 2017-11-25 DIAGNOSIS — F0150 Vascular dementia without behavioral disturbance: Secondary | ICD-10-CM | POA: Diagnosis not present

## 2017-11-26 DIAGNOSIS — Z993 Dependence on wheelchair: Secondary | ICD-10-CM | POA: Diagnosis not present

## 2017-11-26 DIAGNOSIS — Z79899 Other long term (current) drug therapy: Secondary | ICD-10-CM | POA: Diagnosis not present

## 2017-11-26 DIAGNOSIS — Z23 Encounter for immunization: Secondary | ICD-10-CM | POA: Diagnosis not present

## 2017-11-26 DIAGNOSIS — T148XXA Other injury of unspecified body region, initial encounter: Secondary | ICD-10-CM | POA: Diagnosis not present

## 2017-11-28 DIAGNOSIS — R0989 Other specified symptoms and signs involving the circulatory and respiratory systems: Secondary | ICD-10-CM | POA: Diagnosis not present

## 2017-11-29 DIAGNOSIS — Z79899 Other long term (current) drug therapy: Secondary | ICD-10-CM | POA: Diagnosis not present

## 2017-11-29 DIAGNOSIS — I10 Essential (primary) hypertension: Secondary | ICD-10-CM | POA: Diagnosis not present

## 2017-11-29 DIAGNOSIS — E119 Type 2 diabetes mellitus without complications: Secondary | ICD-10-CM | POA: Diagnosis not present

## 2017-11-29 DIAGNOSIS — F0150 Vascular dementia without behavioral disturbance: Secondary | ICD-10-CM | POA: Diagnosis not present

## 2017-12-01 DIAGNOSIS — R0902 Hypoxemia: Secondary | ICD-10-CM | POA: Diagnosis not present

## 2017-12-01 DIAGNOSIS — F329 Major depressive disorder, single episode, unspecified: Secondary | ICD-10-CM | POA: Diagnosis not present

## 2017-12-01 DIAGNOSIS — G301 Alzheimer's disease with late onset: Secondary | ICD-10-CM | POA: Diagnosis not present

## 2017-12-01 DIAGNOSIS — Z87440 Personal history of urinary (tract) infections: Secondary | ICD-10-CM | POA: Diagnosis not present

## 2017-12-01 DIAGNOSIS — I129 Hypertensive chronic kidney disease with stage 1 through stage 4 chronic kidney disease, or unspecified chronic kidney disease: Secondary | ICD-10-CM | POA: Diagnosis not present

## 2017-12-01 DIAGNOSIS — I69391 Dysphagia following cerebral infarction: Secondary | ICD-10-CM | POA: Diagnosis not present

## 2017-12-01 DIAGNOSIS — F419 Anxiety disorder, unspecified: Secondary | ICD-10-CM | POA: Diagnosis not present

## 2017-12-01 DIAGNOSIS — I2510 Atherosclerotic heart disease of native coronary artery without angina pectoris: Secondary | ICD-10-CM | POA: Diagnosis not present

## 2017-12-01 DIAGNOSIS — N183 Chronic kidney disease, stage 3 (moderate): Secondary | ICD-10-CM | POA: Diagnosis not present

## 2017-12-01 DIAGNOSIS — E1122 Type 2 diabetes mellitus with diabetic chronic kidney disease: Secondary | ICD-10-CM | POA: Diagnosis not present

## 2017-12-01 DIAGNOSIS — F0280 Dementia in other diseases classified elsewhere without behavioral disturbance: Secondary | ICD-10-CM | POA: Diagnosis not present

## 2017-12-01 DIAGNOSIS — I69320 Aphasia following cerebral infarction: Secondary | ICD-10-CM | POA: Diagnosis not present

## 2017-12-02 DIAGNOSIS — I129 Hypertensive chronic kidney disease with stage 1 through stage 4 chronic kidney disease, or unspecified chronic kidney disease: Secondary | ICD-10-CM | POA: Diagnosis not present

## 2017-12-02 DIAGNOSIS — I69391 Dysphagia following cerebral infarction: Secondary | ICD-10-CM | POA: Diagnosis not present

## 2017-12-02 DIAGNOSIS — E1122 Type 2 diabetes mellitus with diabetic chronic kidney disease: Secondary | ICD-10-CM | POA: Diagnosis not present

## 2017-12-02 DIAGNOSIS — F0280 Dementia in other diseases classified elsewhere without behavioral disturbance: Secondary | ICD-10-CM | POA: Diagnosis not present

## 2017-12-02 DIAGNOSIS — I69320 Aphasia following cerebral infarction: Secondary | ICD-10-CM | POA: Diagnosis not present

## 2017-12-02 DIAGNOSIS — G301 Alzheimer's disease with late onset: Secondary | ICD-10-CM | POA: Diagnosis not present

## 2017-12-22 DEATH — deceased

## 2018-03-20 IMAGING — CT CT ANGIO CHEST
2 of 6 series · 17 of 46 positions shown · IV contrast (APPLIED)
Comparison: None.

CLINICAL DATA: Recent echocardiogram at outside facility suggests
dilated aortic root, followup examination.

EXAM:
CT ANGIOGRAPHY CHEST WITH CONTRAST
TECHNIQUE: Multidetector CT imaging of the chest was performed using the
standard protocol during bolus administration of intravenous
contrast. Multiplanar CT image reconstructions and MIPs were
obtained to evaluate the vascular anatomy.
CONTRAST:  100 mL Isovue 370.

[Series 4: axial arterial · axial · arterial · 0.94mm/px · z∈[+980,+1235]mm · 14 of 96 slices shown]
[im 6/96  lung]
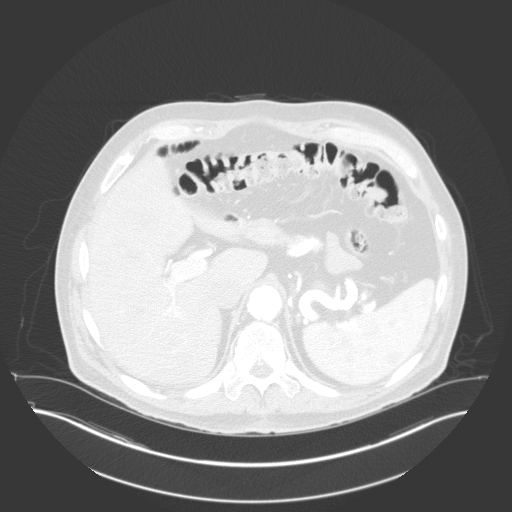
[im 11/96  soft-tissue]
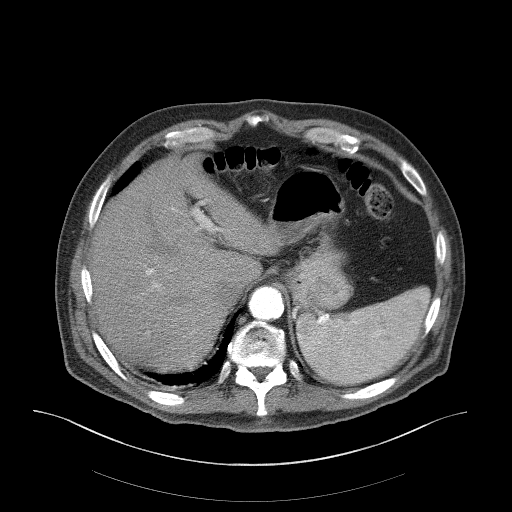
[im 21/96  lung]
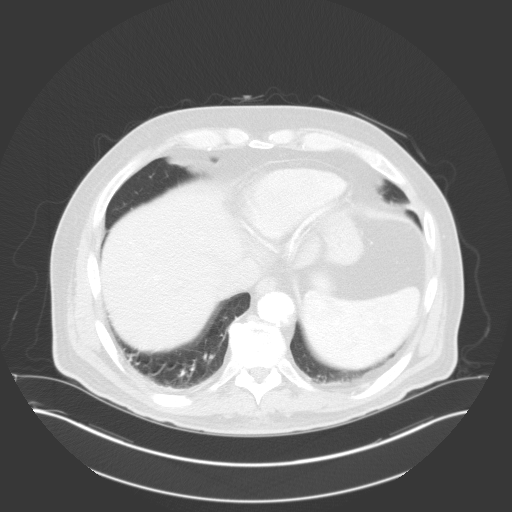
[im 26/96  soft-tissue]
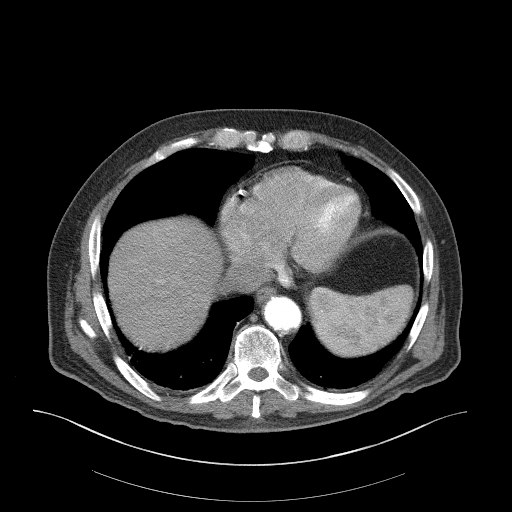
[im 31/96  lung]
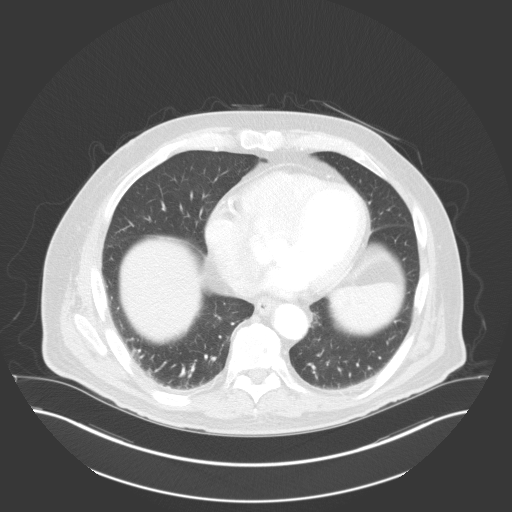
[im 41/96  soft-tissue]
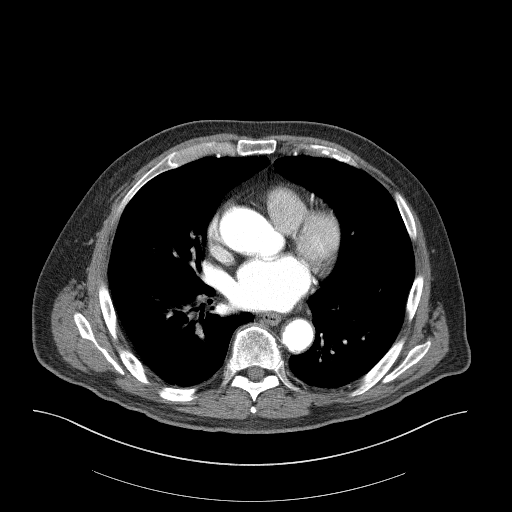
[im 46/96  lung]
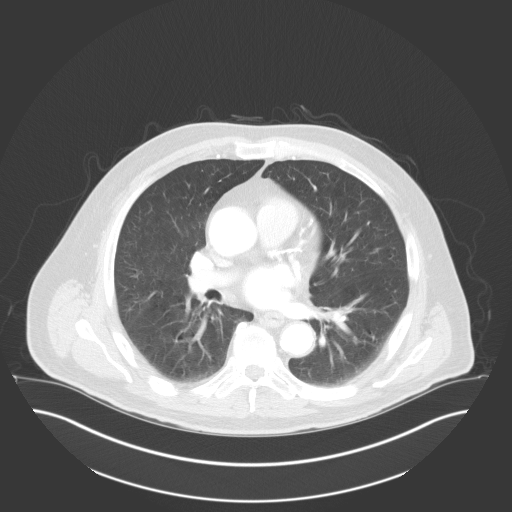
[im 51/96  soft-tissue]
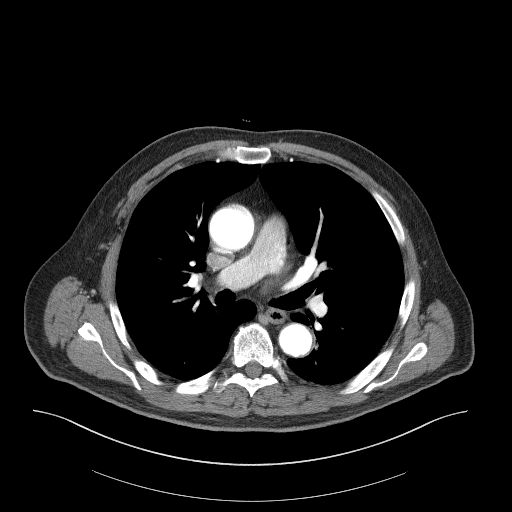
[im 56/96  lung]
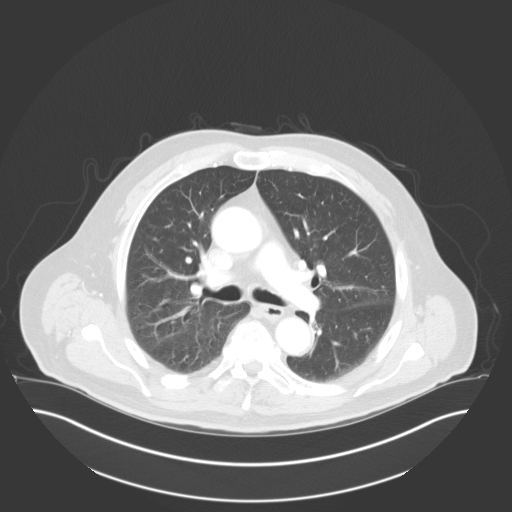
[im 66/96  soft-tissue]
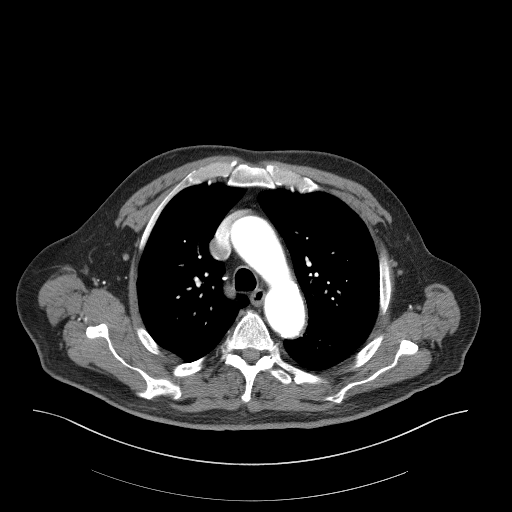
[im 71/96  lung]
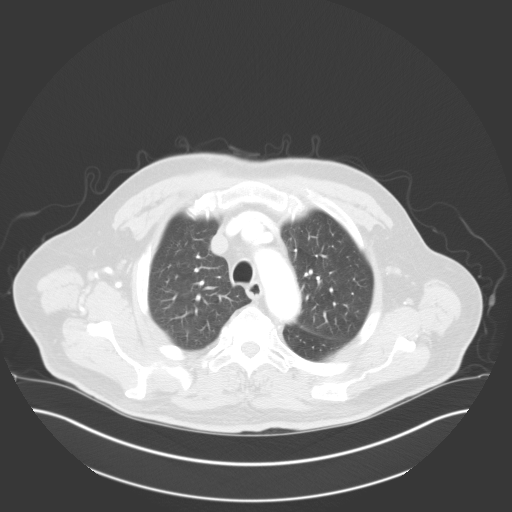
[im 76/96  soft-tissue]
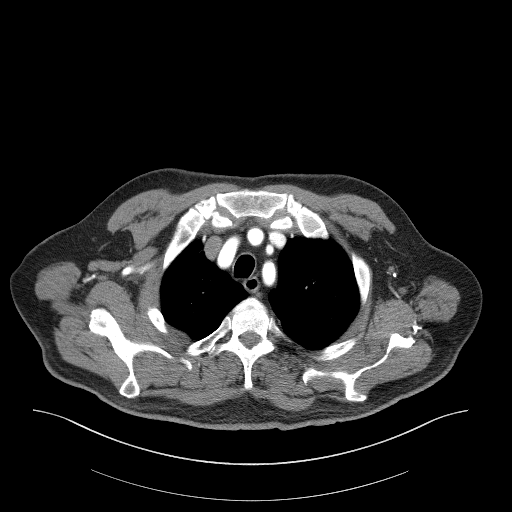
[im 86/96  lung]
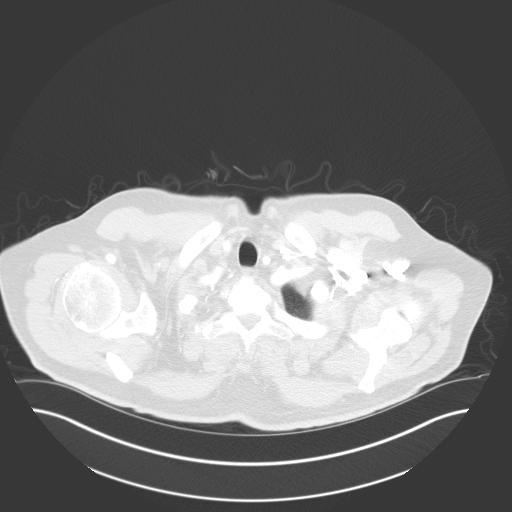
[im 91/96  soft-tissue]
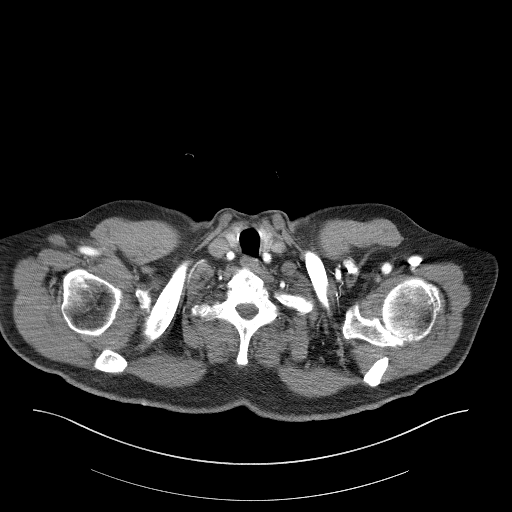

[Series 6: coronal · coronal · 0.63mm/px · 3 of 109 slices shown]
[im 28/109  soft-tissue]
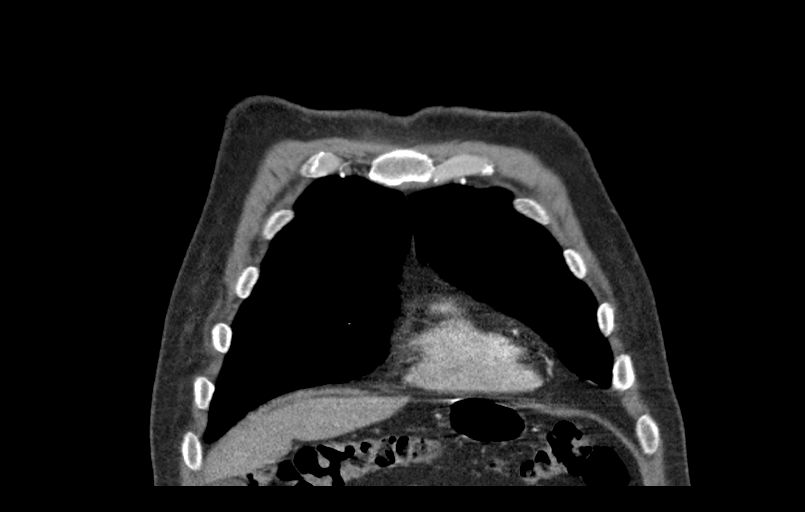
[im 55/109  soft-tissue]
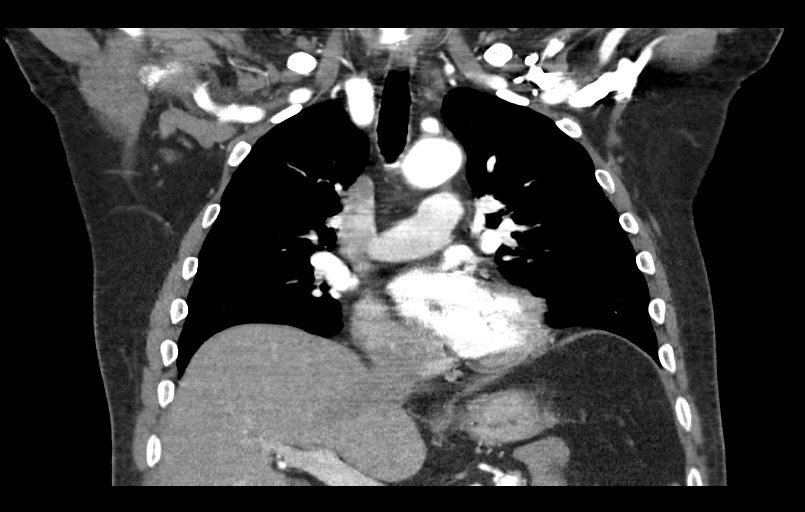
[im 82/109  soft-tissue]
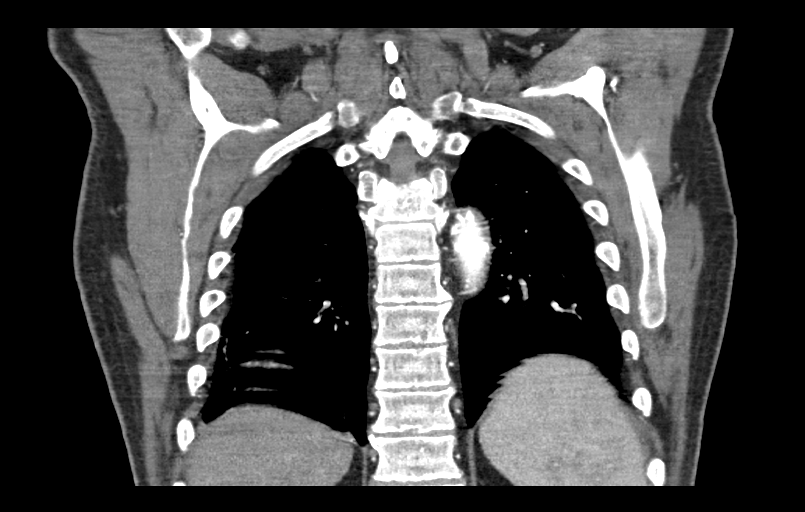

[17 of 46 positions shown; findings below may reference images not displayed]

FINDINGS: Vascular: The thoracic aorta and its branches are well visualized
with mild atherosclerotic calcifications. Some motion artifact
limits evaluation of the ascending aorta. The proximal aorta
measures approximately 4.6 cm at the level of the sinus of Valsalva.
The Tamila junction measures approximately 3.8 cm in greatest
dimension. The ascending aorta at the level of the pulmonary artery
measures 4.2 cm. The mid thoracic arch demonstrates some tapering to
3.7 cm with the descending thoracic aorta measuring 2.9 cm in
greatest dimension. No findings to suggest dissection are noted.
Some mild noncalcified plaque is noted in the descending thoracic
aorta with some regularity which may represent ulcerations.

The pulmonary artery demonstrates a normal branching pattern. No
central pulmonary emboli are seen. Heavy coronary calcifications are
noted.

Nonvascular: The lungs are well aerated bilaterally. Subpleural fat
is noted along the posterior aspect of the left lower lobe. No focal
infiltrate or sizable effusion is seen. The hilar and mediastinal
structures show no significant lymphadenopathy. The thoracic inlet
is within normal limits. The osseous structures show degenerative
change of the thoracic spine. No other focal abnormality is noted.
The visualized upper abdomen is within normal limits.

Review of the MIP images confirms the above findings.
IMPRESSION: Dilatation of the proximal aorta as described although evaluation is
somewhat limited by patient motion artifact.

Noncalcified plaque in the descending thoracic aorta just above the
aortic hiatus with changes suggestive of ulceration.

## 2018-12-02 IMAGING — DX DG CHEST 2V
2 series · 2 of 2 positions shown · non-contrast
Comparison: CT scan of the chest April 27, 2015

CLINICAL DATA: Five days of cough. History of previous CVA,
thoracic aortic aneurysm, former smoker.

EXAM:
CHEST  2 VIEW

[chest pa]
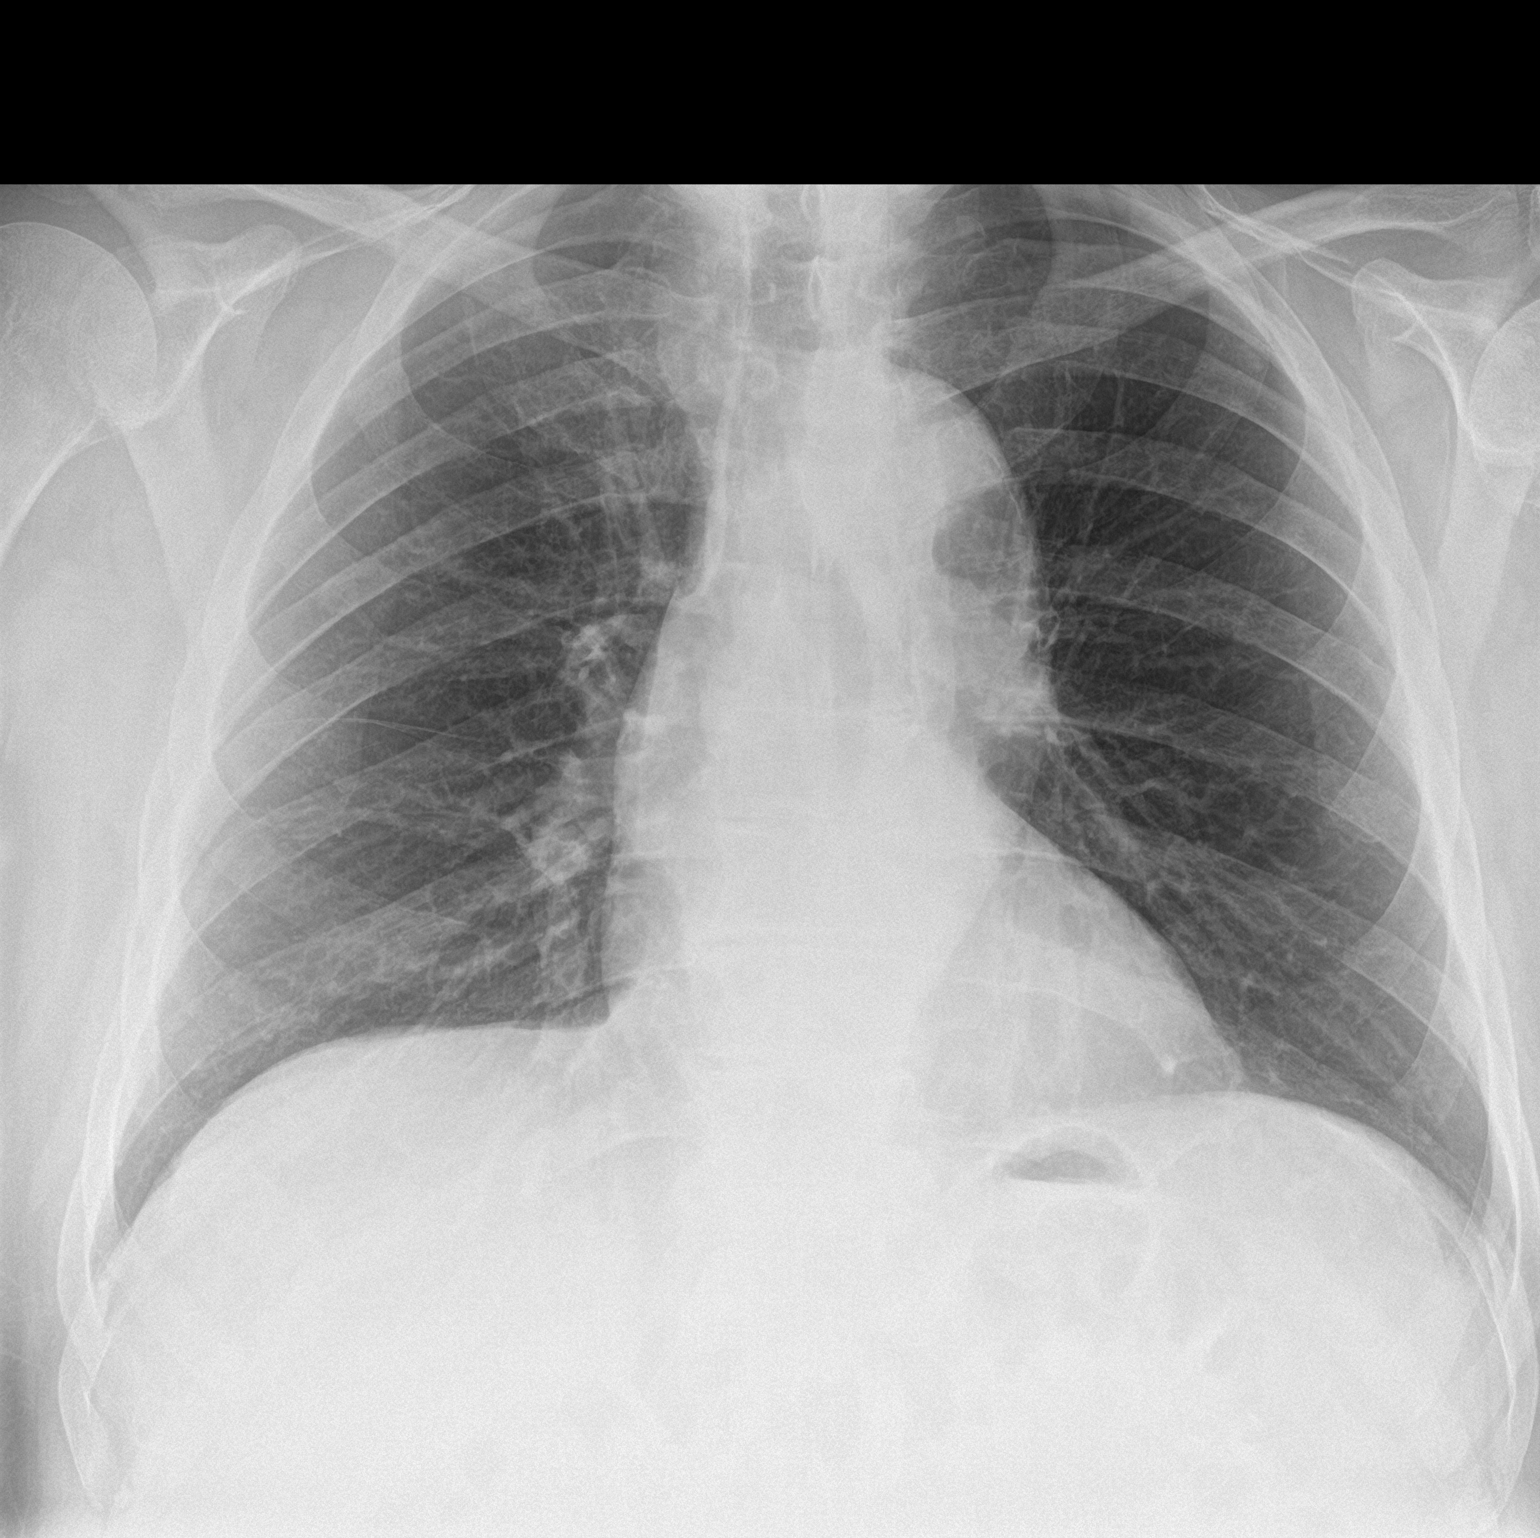

[chest lat]
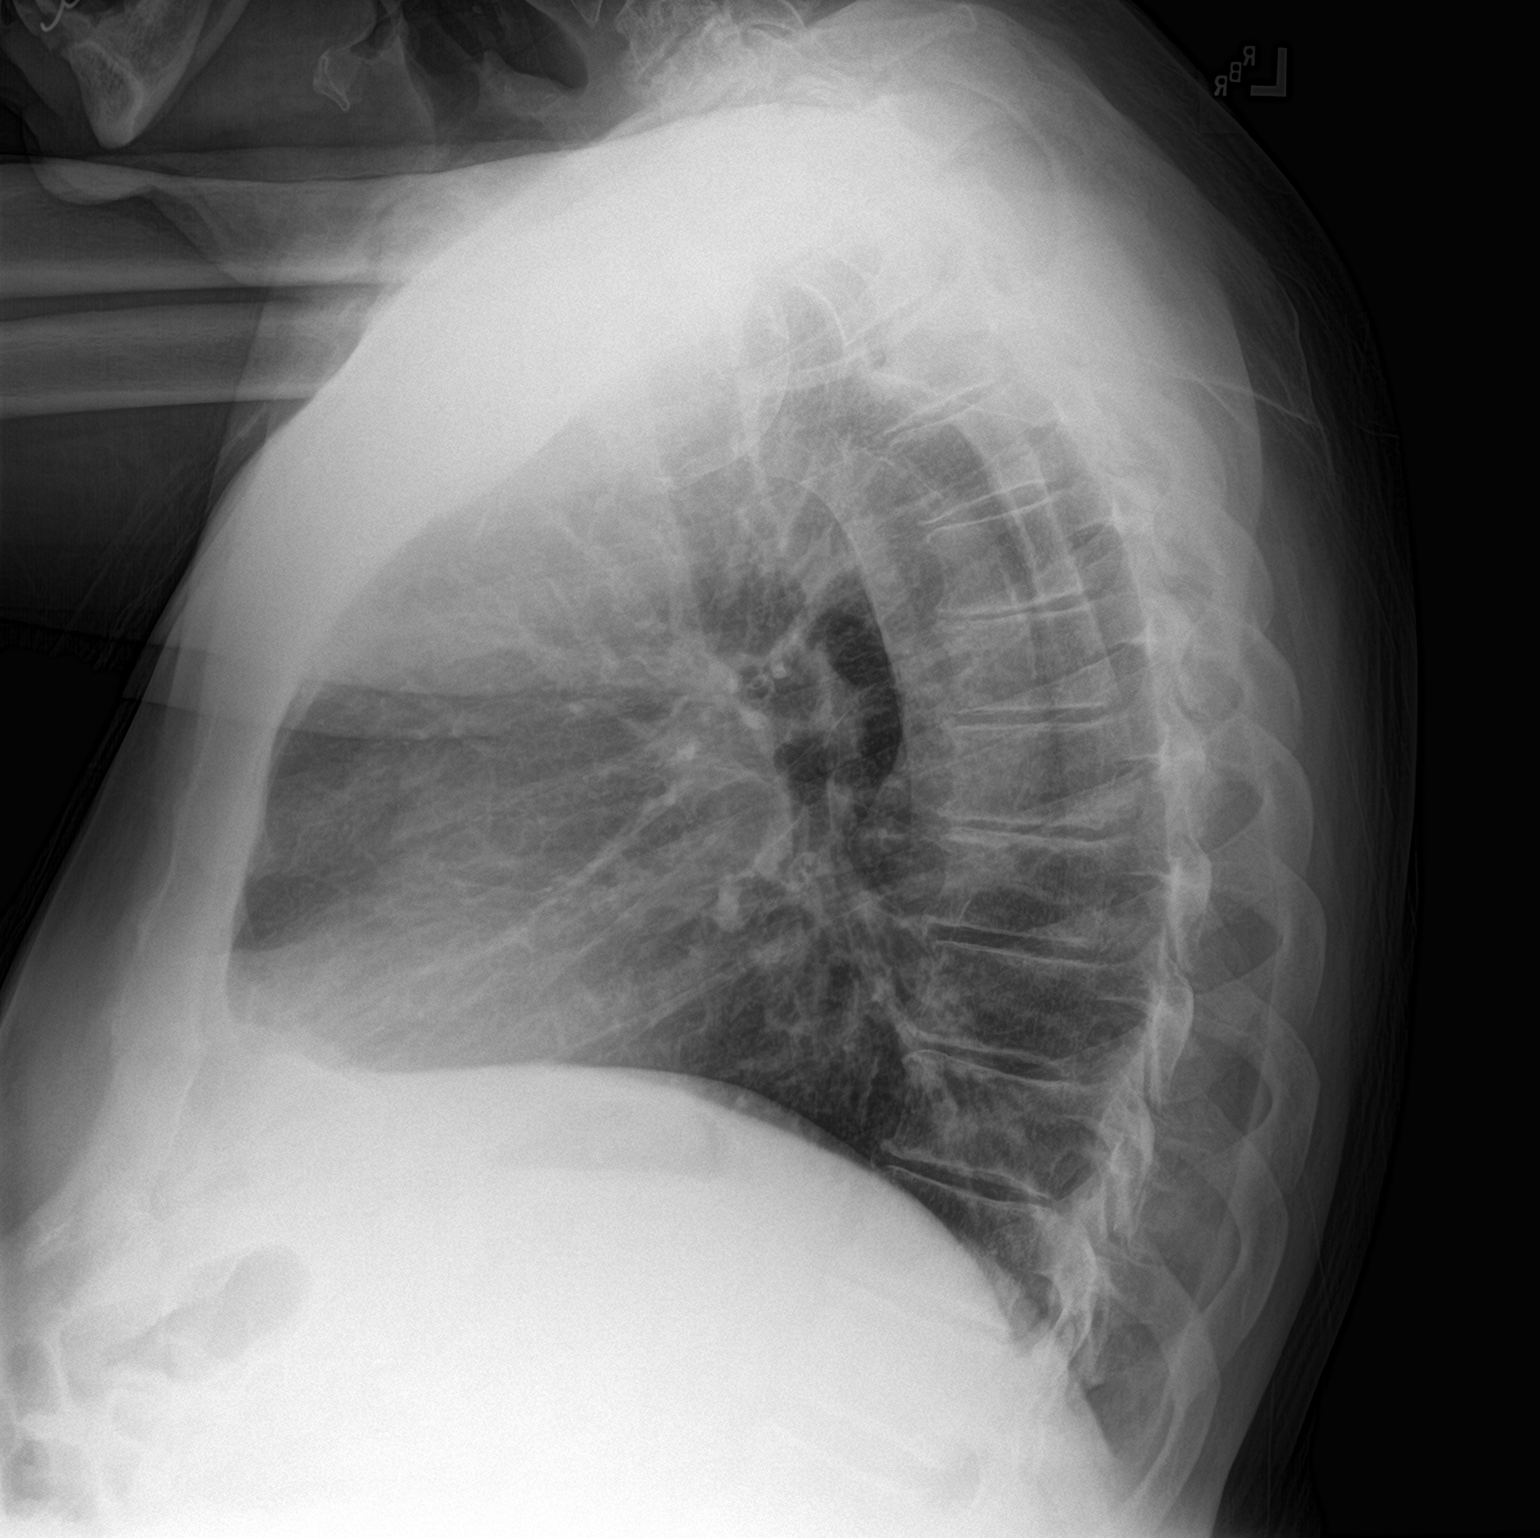

[2 of 2 positions shown; findings below may reference images not displayed]

FINDINGS: The lungs are adequately inflated and clear. The heart and pulmonary
vascularity are normal. There is tortuosity of the descending
thoracic aorta with mural calcification present. There is no pleural
effusion. The bony thorax exhibits no acute abnormality.
IMPRESSION: There is no acute cardiopulmonary abnormality.

Thoracic aortic atherosclerosis.
